# Patient Record
Sex: Male | Born: 1975 | Hispanic: Yes | Marital: Married | Smoking: Former smoker
Health system: Southern US, Community
[De-identification: ages and names within clinical notes are randomized; demographics above are authoritative.]

## PROBLEM LIST (undated history)

## (undated) DIAGNOSIS — E785 Hyperlipidemia, unspecified: Secondary | ICD-10-CM

## (undated) DIAGNOSIS — I10 Essential (primary) hypertension: Secondary | ICD-10-CM

## (undated) HISTORY — PX: REFRACTIVE SURGERY: SHX103

## (undated) HISTORY — DX: Hyperlipidemia, unspecified: E78.5

## (undated) HISTORY — DX: Essential (primary) hypertension: I10

---

## 2021-05-14 ENCOUNTER — Encounter: Payer: Self-pay | Admitting: Nurse Practitioner

## 2021-05-14 ENCOUNTER — Other Ambulatory Visit: Payer: Self-pay

## 2021-05-14 ENCOUNTER — Ambulatory Visit (INDEPENDENT_AMBULATORY_CARE_PROVIDER_SITE_OTHER): Payer: Self-pay | Admitting: Nurse Practitioner

## 2021-05-14 VITALS — BP 125/83 | HR 69 | Temp 98.0°F | Ht 68.0 in | Wt 185.0 lb

## 2021-05-14 DIAGNOSIS — I10 Essential (primary) hypertension: Secondary | ICD-10-CM

## 2021-05-14 DIAGNOSIS — Z7689 Persons encountering health services in other specified circumstances: Secondary | ICD-10-CM

## 2021-05-14 DIAGNOSIS — J029 Acute pharyngitis, unspecified: Secondary | ICD-10-CM

## 2021-05-14 DIAGNOSIS — R7303 Prediabetes: Secondary | ICD-10-CM | POA: Insufficient documentation

## 2021-05-14 DIAGNOSIS — E785 Hyperlipidemia, unspecified: Secondary | ICD-10-CM | POA: Insufficient documentation

## 2021-05-14 MED ORDER — METHYLPREDNISOLONE 4 MG PO TBPK: ORAL_TABLET | ORAL | 0 refills | Status: DC

## 2021-05-14 MED ORDER — AMLODIPINE BESYLATE 5 MG PO TABS: 5.0000 mg | ORAL_TABLET | Freq: Every day | ORAL | 0 refills | Status: DC

## 2021-05-14 MED ORDER — PRAVASTATIN SODIUM 20 MG PO TABS: 20.0000 mg | ORAL_TABLET | Freq: Every day | ORAL | 1 refills | Status: DC

## 2021-05-14 MED ORDER — VALSARTAN-HYDROCHLOROTHIAZIDE 160-12.5 MG PO TABS: 1.0000 | ORAL_TABLET | Freq: Every day | ORAL | 1 refills | Status: DC

## 2021-05-14 NOTE — Assessment & Plan Note (Signed)
Chronic. Patient unsure of diabetic status. Labs ordered today. will make recommendations based on lab results.

## 2021-05-14 NOTE — Assessment & Plan Note (Signed)
Chronic.  On pravastatin 20mg . Refill sent today. Labs ordered today. Will make recommendations based on lab results.

## 2021-05-14 NOTE — Progress Notes (Signed)
BP 125/83   Pulse 69   Temp 98 F (36.7 C) (Oral)   Ht $R'5\' 8"'sB$  (1.727 m)   Wt 185 lb (83.9 kg)   SpO2 96%   BMI 28.13 kg/m    Subjective:    Patient ID: Jason King, male    DOB: July 28, 1976, 45 y.o.   MRN: [id number]  HPI: Jason King is a 45 y.o. male  Chief Complaint  Patient presents with   Establish Care   Hypertension    Patient is here to discuss his elevated blood pressure reading over the last month. Patient states in the mornings is when he notices his blood pressure to be high, but states it is before he takes his medication.    Hyperlipidemia   Medication Refill    Patient is requesting medication refills.    Patient presents to clinic to establish care with new PCP.  Patient recently moved here from [place].  Patient reports a history of Hypertension, HLD, prediabetes.  Patient denies a history of: Thyroid problems, Depression, Anxiety, Neurological problems, and Abdominal problems.   Patient would like to discuss decreasing his medication to Amlodipine $RemoveBefor'5mg'pyQUNiHedHBi$ . States he was previously a smoker but has since stopped smoking and thinks he needs to decrease his medication.  Patient denies symptoms of hypotension including lightheaded and dizziness.  Denies HA, CP, SOB, dizziness, palpitations, visual changes, and lower extremity swelling.   Patient complains of a sore throat. Denies fever, congestion, cough.  States it hurts worse in the morning. Believes he needs something to help the sore throat go away.   Active Ambulatory Problems    Diagnosis Date Noted   Primary hypertension 05/14/2021   Hyperlipidemia 05/14/2021   Prediabetes 05/14/2021   Resolved Ambulatory Problems    Diagnosis Date Noted   No Resolved Ambulatory Problems   Past Medical History:  Diagnosis Date   Hypertension    Past Surgical History:  Procedure Laterality Date   REFRACTIVE SURGERY Bilateral     History reviewed. No pertinent family history.  Relevant past medical, surgical, family  and social history reviewed and updated as indicated. Interim medical history since our last visit reviewed. Allergies and medications reviewed and updated.  Review of Systems  Constitutional:  Negative for fever.  HENT:  Positive for sore throat. Negative for congestion.   Eyes:  Negative for visual disturbance.  Respiratory:  Negative for cough and shortness of breath.   Cardiovascular:  Negative for chest pain and leg swelling.  Neurological:  Negative for light-headedness and headaches.   Per HPI unless specifically indicated above     Objective:    BP 125/83   Pulse 69   Temp 98 F (36.7 C) (Oral)   Ht $R'5\' 8"'no$  (1.727 m)   Wt 185 lb (83.9 kg)   SpO2 96%   BMI 28.13 kg/m   Wt Readings from Last 3 Encounters:  05/14/21 185 lb (83.9 kg)    Physical Exam Vitals and nursing note reviewed.  Constitutional:      General: He is not in acute distress.    Appearance: Normal appearance. He is not ill-appearing, toxic-appearing or diaphoretic.  HENT:     Head: Normocephalic.     Right Ear: External ear normal.     Left Ear: External ear normal.     Nose: Nose normal. No congestion or rhinorrhea.     Mouth/Throat:     Mouth: Mucous membranes are moist.     Pharynx: No oropharyngeal exudate or  posterior oropharyngeal erythema.  Eyes:     General:        Right eye: No discharge.        Left eye: No discharge.     Extraocular Movements: Extraocular movements intact.     Conjunctiva/sclera: Conjunctivae normal.     Pupils: Pupils are equal, round, and reactive to light.  Cardiovascular:     Rate and Rhythm: Normal rate and regular rhythm.     Heart sounds: No murmur heard. Pulmonary:     Effort: Pulmonary effort is normal. No respiratory distress.     Breath sounds: Normal breath sounds. No wheezing, rhonchi or rales.  Abdominal:     General: Abdomen is flat. Bowel sounds are normal.  Musculoskeletal:     Cervical back: Normal range of motion and neck supple.  Skin:     General: Skin is warm and dry.     Capillary Refill: Capillary refill takes less than 2 seconds.  Neurological:     General: No focal deficit present.     Mental Status: He is alert and oriented to person, place, and time.  Psychiatric:        Mood and Affect: Mood normal.        Behavior: Behavior normal.        Thought Content: Thought content normal.        Judgment: Judgment normal.    No results found for this or any previous visit.    Assessment & Plan:   Problem List Items Addressed This Visit       Cardiovascular and Mediastinum   Primary hypertension - Primary    Chronic. Controlled. Patient insistent on decreasing his Amlodipine dose to $Remov'5mg'smLIoB$ . I advised him that he should not decrease the dose since he states his blood pressure is 140-150/80-90 at home. However, it is controlled in the office. Recommend patient bring in his home blood pressure monitor so we can compare with blood pressure monitor in the office. Sent in amlodipine $RemoveBefor'5mg'SbcSSXHHQVng$  for patient to trial and see if blood pressure increases. Discussed that high blood pressure can increase the risk of stroke and heart attack. Discussed with patient that if blood pressure increases, it will be recommended that he increase dose back to $Remov'10mg'gjDrSH$ .        Relevant Medications   amLODipine (NORVASC) 5 MG tablet   pravastatin (PRAVACHOL) 20 MG tablet   valsartan-hydrochlorothiazide (DIOVAN-HCT) 160-12.5 MG tablet   Other Relevant Orders   Comp Met (CMET)     Other   Hyperlipidemia    Chronic.  On pravastatin $RemoveBefore'20mg'sVGJqKhMATrLn$ . Refill sent today. Labs ordered today. Will make recommendations based on lab results.        Relevant Medications   amLODipine (NORVASC) 5 MG tablet   pravastatin (PRAVACHOL) 20 MG tablet   valsartan-hydrochlorothiazide (DIOVAN-HCT) 160-12.5 MG tablet   Other Relevant Orders   Lipid Profile   Prediabetes    Chronic. Patient unsure of diabetic status. Labs ordered today. will make recommendations based on lab  results.        Relevant Orders   HgB A1c   Other Visit Diagnoses     Sore throat       Medrol dose pack sent to the pharmacy to treat patient's sore throat. Follow up if symptoms worsen or fail to improve.   Encounter to establish care            Follow up plan: Return in about 1 month (around 06/14/2021) for BP Check.

## 2021-05-14 NOTE — Assessment & Plan Note (Signed)
Chronic. Controlled. Patient insistent on decreasing his Amlodipine dose to 5mg . I advised him that he should not decrease the dose since he states his blood pressure is 140-150/80-90 at home. However, it is controlled in the office. Recommend patient bring in his home blood pressure monitor so we can compare with blood pressure monitor in the office. Sent in amlodipine 5mg  for patient to trial and see if blood pressure increases. Discussed that high blood pressure can increase the risk of stroke and heart attack. Discussed with patient that if blood pressure increases, it will be recommended that he increase dose back to 10mg .

## 2021-05-15 LAB — COMPREHENSIVE METABOLIC PANEL
ALT: 30 IU/L (ref 0–44)
AST: 17 IU/L (ref 0–40)
Albumin/Globulin Ratio: 2.3 — ABNORMAL HIGH (ref 1.2–2.2)
Albumin: 4.8 g/dL (ref 4.0–5.0)
Alkaline Phosphatase: 89 IU/L (ref 44–121)
BUN/Creatinine Ratio: 14 (ref 9–20)
BUN: 21 mg/dL (ref 6–24)
Bilirubin Total: 1 mg/dL (ref 0.0–1.2)
CO2: 29 mmol/L (ref 20–29)
Calcium: 9.1 mg/dL (ref 8.7–10.2)
Chloride: 97 mmol/L (ref 96–106)
Creatinine, Ser: 1.54 mg/dL — ABNORMAL HIGH (ref 0.76–1.27)
Globulin, Total: 2.1 g/dL (ref 1.5–4.5)
Glucose: 119 mg/dL — ABNORMAL HIGH (ref 65–99)
Potassium: 2.6 mmol/L — ABNORMAL LOW (ref 3.5–5.2)
Sodium: 142 mmol/L (ref 134–144)
Total Protein: 6.9 g/dL (ref 6.0–8.5)
eGFR: 57 mL/min/{1.73_m2} — ABNORMAL LOW (ref >59)

## 2021-05-15 LAB — LIPID PANEL
Chol/HDL Ratio: 3.7 ratio (ref 0.0–5.0)
Cholesterol, Total: 147 mg/dL (ref 100–199)
HDL: 40 mg/dL (ref >39)
LDL Chol Calc (NIH): 75 mg/dL (ref 0–99)
Triglycerides: 187 mg/dL — ABNORMAL HIGH (ref 0–149)
VLDL Cholesterol Cal: 32 mg/dL (ref 5–40)

## 2021-05-15 LAB — HEMOGLOBIN A1C
Est. average glucose Bld gHb Est-mCnc: 137 mg/dL
Hgb A1c MFr Bld: 6.4 % — ABNORMAL HIGH (ref 4.8–5.6)

## 2021-05-18 NOTE — Addendum Note (Signed)
Addended by: [name] on: 05/18/2021 10:28 AM   Modules accepted: Orders

## 2021-05-18 NOTE — Progress Notes (Signed)
Please let patient know that his lab work shows that his cholesterol is well controlled.  A1c is stable at 6.4 and remains in the prediabetic range. Continue with a low carb diet and exercise.  Patient's Kidney function is declined.  I recommend he repeat it in 1 week.  I have placed the ordered. If labs continue to be abnormal he will likely need to see Nephrology to find out why kidney function is abnormal. Please make patient a lab appointment for 1 week.

## 2021-06-15 ENCOUNTER — Other Ambulatory Visit: Payer: Self-pay

## 2021-06-15 ENCOUNTER — Ambulatory Visit (INDEPENDENT_AMBULATORY_CARE_PROVIDER_SITE_OTHER): Payer: Self-pay | Admitting: Nurse Practitioner

## 2021-06-15 ENCOUNTER — Encounter: Payer: Self-pay | Admitting: Nurse Practitioner

## 2021-06-15 VITALS — BP 123/83 | HR 72 | Temp 98.0°F | Ht 68.5 in | Wt 187.4 lb

## 2021-06-15 DIAGNOSIS — I10 Essential (primary) hypertension: Secondary | ICD-10-CM

## 2021-06-15 DIAGNOSIS — R7989 Other specified abnormal findings of blood chemistry: Secondary | ICD-10-CM

## 2021-06-15 MED ORDER — VALSARTAN-HYDROCHLOROTHIAZIDE 160-12.5 MG PO TABS
1.0000 | ORAL_TABLET | Freq: Every day | ORAL | 1 refills | Status: DC
Start: 2021-06-15 — End: 2021-08-17

## 2021-06-15 MED ORDER — AMLODIPINE BESYLATE 10 MG PO TABS: 10.0000 mg | ORAL_TABLET | Freq: Every day | ORAL | 1 refills | Status: DC

## 2021-06-15 MED ORDER — PRAVASTATIN SODIUM 20 MG PO TABS: 20.0000 mg | ORAL_TABLET | Freq: Every day | ORAL | 1 refills | Status: DC

## 2021-06-15 NOTE — Progress Notes (Signed)
BP 123/83   Pulse 72   Temp 98 F (36.7 C)   Ht 5' 8.5" (1.74 m)   Wt 187 lb 6 oz (85 kg)   SpO2 94%   BMI 28.08 kg/m    Subjective:    Patient ID: Jason King, male    DOB: Sep 28, 1976, 45 y.o.   MRN: [id number]  HPI: Jason King is a 45 y.o. male  Chief Complaint  Patient presents with  . Hypertension   HYPERTENSION Hypertension status: controlled  Satisfied with current treatment? no Duration of hypertension: years BP monitoring frequency:  daily BP range: 130-140/80 BP medication side effects:  no Medication compliance: excellent compliance Previous BP meds: Amlodipine, valsartan, HCTZ Aspirin: no Recurrent headaches: no Visual changes: no Palpitations: no Dyspnea: no Chest pain: no Lower extremity edema: no Dizzy/lightheaded: no  Relevant past medical, surgical, family and social history reviewed and updated as indicated. Interim medical history since our last visit reviewed. Allergies and medications reviewed and updated.  Review of Systems  Eyes:  Negative for visual disturbance.  Respiratory:  Negative for chest tightness and shortness of breath.   Cardiovascular:  Negative for chest pain, palpitations and leg swelling.  Neurological:  Negative for dizziness, light-headedness and headaches.   Per HPI unless specifically indicated above     Objective:    BP 123/83   Pulse 72   Temp 98 F (36.7 C)   Ht 5' 8.5" (1.74 m)   Wt 187 lb 6 oz (85 kg)   SpO2 94%   BMI 28.08 kg/m   Wt Readings from Last 3 Encounters:  06/15/21 187 lb 6 oz (85 kg)  05/14/21 185 lb (83.9 kg)    Physical Exam Vitals and nursing note reviewed.  Constitutional:      General: He is not in acute distress.    Appearance: Normal appearance. He is not ill-appearing, toxic-appearing or diaphoretic.  HENT:     Head: Normocephalic.     Right Ear: External ear normal.     Left Ear: External ear normal.     Nose: Nose normal. No congestion or rhinorrhea.     Mouth/Throat:      Mouth: Mucous membranes are moist.  Eyes:     General:        Right eye: No discharge.        Left eye: No discharge.     Extraocular Movements: Extraocular movements intact.     Conjunctiva/sclera: Conjunctivae normal.     Pupils: Pupils are equal, round, and reactive to light.  Cardiovascular:     Rate and Rhythm: Normal rate and regular rhythm.     Heart sounds: No murmur heard. Pulmonary:     Effort: Pulmonary effort is normal. No respiratory distress.     Breath sounds: Normal breath sounds. No wheezing, rhonchi or rales.  Abdominal:     General: Abdomen is flat. Bowel sounds are normal.  Musculoskeletal:     Cervical back: Normal range of motion and neck supple.  Skin:    General: Skin is warm and dry.     Capillary Refill: Capillary refill takes less than 2 seconds.  Neurological:     General: No focal deficit present.     Mental Status: He is alert and oriented to person, place, and time.  Psychiatric:        Mood and Affect: Mood normal.        Behavior: Behavior normal.        Thought  Content: Thought content normal.        Judgment: Judgment normal.    Results for orders placed or performed in visit on 06/15/21  Comp Met (CMET)  Result Value Ref Range   Glucose 186 (H) 65 - 99 mg/dL   BUN 23 6 - 24 mg/dL   Creatinine, Ser 1.55 (H) 0.76 - 1.27 mg/dL   eGFR 56 (L) >59 mL/min/1.73   BUN/Creatinine Ratio 15 9 - 20   Sodium 140 134 - 144 mmol/L   Potassium 3.5 3.5 - 5.2 mmol/L   Chloride 98 96 - 106 mmol/L   CO2 27 20 - 29 mmol/L   Calcium 9.2 8.7 - 10.2 mg/dL   Total Protein 7.0 6.0 - 8.5 g/dL   Albumin 4.7 4.0 - 5.0 g/dL   Globulin, Total 2.3 1.5 - 4.5 g/dL   Albumin/Globulin Ratio 2.0 1.2 - 2.2   Bilirubin Total 0.9 0.0 - 1.2 mg/dL   Alkaline Phosphatase 88 44 - 121 IU/L   AST 27 0 - 40 IU/L   ALT 27 0 - 44 IU/L      Assessment & Plan:   Problem List Items Addressed This Visit       Cardiovascular and Mediastinum   Primary hypertension -  Primary    Chronic.  Controlled.  Continue with current medication regimen.  Refills sent today.  Labs ordered today.  Return to clinic in 3 months for reevaluation.  Call sooner if concerns arise.        Relevant Medications   amLODipine (NORVASC) 10 MG tablet   valsartan-hydrochlorothiazide (DIOVAN-HCT) 160-12.5 MG tablet   pravastatin (PRAVACHOL) 20 MG tablet   Other Visit Diagnoses     Elevated serum creatinine       Patient's creatinine was elevated at first visit. Will recheck today, if still elevated will send patient to Nephrology.   Relevant Orders   Comp Met (CMET) (Completed)        Follow up plan: Return in about 2 months (around 08/15/2021) for HTN, HLD, DM2 FU.

## 2021-06-16 ENCOUNTER — Encounter: Payer: Self-pay | Admitting: Nurse Practitioner

## 2021-06-16 ENCOUNTER — Telehealth: Payer: Self-pay

## 2021-06-16 LAB — COMPREHENSIVE METABOLIC PANEL
ALT: 27 IU/L (ref 0–44)
AST: 27 IU/L (ref 0–40)
Albumin/Globulin Ratio: 2 (ref 1.2–2.2)
Albumin: 4.7 g/dL (ref 4.0–5.0)
Alkaline Phosphatase: 88 IU/L (ref 44–121)
BUN/Creatinine Ratio: 15 (ref 9–20)
BUN: 23 mg/dL (ref 6–24)
Bilirubin Total: 0.9 mg/dL (ref 0.0–1.2)
CO2: 27 mmol/L (ref 20–29)
Calcium: 9.2 mg/dL (ref 8.7–10.2)
Chloride: 98 mmol/L (ref 96–106)
Creatinine, Ser: 1.55 mg/dL — ABNORMAL HIGH (ref 0.76–1.27)
Globulin, Total: 2.3 g/dL (ref 1.5–4.5)
Glucose: 186 mg/dL — ABNORMAL HIGH (ref 65–99)
Potassium: 3.5 mmol/L (ref 3.5–5.2)
Sodium: 140 mmol/L (ref 134–144)
Total Protein: 7 g/dL (ref 6.0–8.5)
eGFR: 56 mL/min/{1.73_m2} — ABNORMAL LOW (ref >59)

## 2021-06-16 NOTE — Telephone Encounter (Signed)
Called and spoke with patient, he states that he does not have insurance at the time and would like to wait until next year to see the kidney doctor.

## 2021-06-16 NOTE — Assessment & Plan Note (Signed)
Chronic.  Controlled.  Continue with current medication regimen.  Refills sent today.  Labs ordered today.  Return to clinic in 3 months for reevaluation.  Call sooner if concerns arise.   

## 2021-06-16 NOTE — Addendum Note (Signed)
Addended by: [name] on: 06/16/2021 08:58 AM   Modules accepted: Orders

## 2021-06-16 NOTE — Telephone Encounter (Signed)
I understand patient does not have insurance, I do not think he should wait until next year.  We need to figure out why patient is having a decrease in his kidney function before it worsens further.

## 2021-06-16 NOTE — Progress Notes (Signed)
Please let patient know that his kidney function is showing that it has still declined some.  Due to this, I have placed a referral for patient to see the kidney specialist.  Please let me know if patient has any questions.

## 2021-06-16 NOTE — Telephone Encounter (Signed)
Patient notified

## 2021-08-17 ENCOUNTER — Other Ambulatory Visit: Payer: Self-pay

## 2021-08-17 ENCOUNTER — Ambulatory Visit (INDEPENDENT_AMBULATORY_CARE_PROVIDER_SITE_OTHER): Payer: Self-pay | Admitting: Nurse Practitioner

## 2021-08-17 ENCOUNTER — Encounter: Payer: Self-pay | Admitting: Nurse Practitioner

## 2021-08-17 VITALS — BP 138/92 | HR 74 | Temp 98.4°F | Resp 18 | Ht 68.0 in | Wt 185.4 lb

## 2021-08-17 DIAGNOSIS — E785 Hyperlipidemia, unspecified: Secondary | ICD-10-CM

## 2021-08-17 DIAGNOSIS — R7303 Prediabetes: Secondary | ICD-10-CM

## 2021-08-17 DIAGNOSIS — N1831 Chronic kidney disease, stage 3a: Secondary | ICD-10-CM

## 2021-08-17 DIAGNOSIS — I10 Essential (primary) hypertension: Secondary | ICD-10-CM

## 2021-08-17 MED ORDER — PRAVASTATIN SODIUM 20 MG PO TABS: 20.0000 mg | ORAL_TABLET | Freq: Every day | ORAL | 1 refills | Status: DC

## 2021-08-17 MED ORDER — VALSARTAN-HYDROCHLOROTHIAZIDE 160-12.5 MG PO TABS: 1.0000 | ORAL_TABLET | Freq: Every day | ORAL | 1 refills | Status: DC

## 2021-08-17 MED ORDER — AMLODIPINE BESYLATE 10 MG PO TABS: 10.0000 mg | ORAL_TABLET | Freq: Every day | ORAL | 1 refills | Status: DC

## 2021-08-17 NOTE — Assessment & Plan Note (Signed)
Chronic. Patient declines seeing Nephrology. Disscused with patient the importance of getting blood pressure under control to prevent further kidney damage. Patient declines both a referral to Nephrology and increase HCTZ.  Labs ordered today. Will make further recommendations based on lab results.

## 2021-08-17 NOTE — Progress Notes (Signed)
BP (!) 138/92   Pulse 74   Temp 98.4 F (36.9 C) (Oral)   Resp 18   Ht _0  (1.727 m)   Wt 185 lb 6.4 oz (84.1 kg)   SpO2 98%   BMI 28.19 kg/m    Subjective:    Patient ID: Jason King, male    DOB: 1976-04-02, 45 y.o.   MRN: [id number]  HPI: Jason King is a 45 y.o. male  Chief Complaint  Patient presents with   Follow-up    HTN and DM   HYPERTENSION / HYPERLIPIDEMIA Satisfied with current treatment? yes Duration of hypertension: years BP monitoring frequency: weekly BP range: 140-150/90 BP medication side effects: no Past BP meds: amlodipine and valsartan-HCTZ Duration of hyperlipidemia: years Cholesterol medication side effects: no Cholesterol supplements: none Past cholesterol medications: pravastatin (pravachol) Medication compliance: excellent compliance Aspirin: no Recent stressors: no Recurrent headaches: no Visual changes: no Palpitations: no Dyspnea: no Chest pain: no Lower extremity edema: no Dizzy/lightheaded: no  DIABETES Hypoglycemic episodes:no Polydipsia/polyuria: no Visual disturbance: no Chest pain: no Paresthesias: no Glucose Monitoring: no  Accucheck frequency: Not Checking  Fasting glucose:  Post prandial:  Evening:  Before meals: Taking Insulin?: no  Long acting insulin:  Short acting insulin: Blood Pressure Monitoring: weekly Retinal Examination: Not up to Date Foot Exam: Up to Date Diabetic Education: Not Completed Pneumovax: Not up to Date Influenza: Not up to Date Aspirin: no  Relevant past medical, surgical, family and social history reviewed and updated as indicated. Interim medical history since our last visit reviewed. Allergies and medications reviewed and updated.  Review of Systems  Eyes:  Negative for visual disturbance.  Respiratory:  Negative for chest tightness and shortness of breath.   Cardiovascular:  Negative for chest pain, palpitations and leg swelling.  Endocrine: Negative for polydipsia and  polyuria.  Neurological:  Negative for dizziness, light-headedness, numbness and headaches.   Per HPI unless specifically indicated above     Objective:    BP (!) 138/92   Pulse 74   Temp 98.4 F (36.9 C) (Oral)   Resp 18   Ht _1  (1.727 m)   Wt 185 lb 6.4 oz (84.1 kg)   SpO2 98%   BMI 28.19 kg/m   Wt Readings from Last 3 Encounters:  08/17/21 185 lb 6.4 oz (84.1 kg)  06/15/21 187 lb 6 oz (85 kg)  05/14/21 185 lb (83.9 kg)    Physical Exam Vitals and nursing note reviewed.  Constitutional:      General: He is not in acute distress.    Appearance: Normal appearance. He is not ill-appearing, toxic-appearing or diaphoretic.  HENT:     Head: Normocephalic.     Right Ear: External ear normal.     Left Ear: External ear normal.     Nose: Nose normal. No congestion or rhinorrhea.     Mouth/Throat:     Mouth: Mucous membranes are moist.  Eyes:     General:        Right eye: No discharge.        Left eye: No discharge.     Extraocular Movements: Extraocular movements intact.     Conjunctiva/sclera: Conjunctivae normal.     Pupils: Pupils are equal, round, and reactive to light.  Cardiovascular:     Rate and Rhythm: Normal rate and regular rhythm.     Heart sounds: No murmur heard. Pulmonary:     Effort: Pulmonary effort is normal. No respiratory distress.  Breath sounds: Normal breath sounds. No wheezing, rhonchi or rales.  Abdominal:     General: Abdomen is flat. Bowel sounds are normal.  Musculoskeletal:     Cervical back: Normal range of motion and neck supple.  Skin:    General: Skin is warm and dry.     Capillary Refill: Capillary refill takes less than 2 seconds.  Neurological:     General: No focal deficit present.     Mental Status: He is alert and oriented to person, place, and time.  Psychiatric:        Mood and Affect: Mood normal.        Behavior: Behavior normal.        Thought Content: Thought content normal.        Judgment: Judgment normal.     Results for orders placed or performed in visit on 06/15/21  Comp Met (CMET)  Result Value Ref Range   Glucose 186 (H) 65 - 99 mg/dL   BUN 23 6 - 24 mg/dL   Creatinine, Ser 1.55 (H) 0.76 - 1.27 mg/dL   eGFR 56 (L) >59 mL/min/1.73   BUN/Creatinine Ratio 15 9 - 20   Sodium 140 134 - 144 mmol/L   Potassium 3.5 3.5 - 5.2 mmol/L   Chloride 98 96 - 106 mmol/L   CO2 27 20 - 29 mmol/L   Calcium 9.2 8.7 - 10.2 mg/dL   Total Protein 7.0 6.0 - 8.5 g/dL   Albumin 4.7 4.0 - 5.0 g/dL   Globulin, Total 2.3 1.5 - 4.5 g/dL   Albumin/Globulin Ratio 2.0 1.2 - 2.2   Bilirubin Total 0.9 0.0 - 1.2 mg/dL   Alkaline Phosphatase 88 44 - 121 IU/L   AST 27 0 - 40 IU/L   ALT 27 0 - 44 IU/L      Assessment & Plan:   Problem List Items Addressed This Visit       Cardiovascular and Mediastinum   Primary hypertension - Primary    Chronic.  Uncontrolled.  Would like to increase HCTZ however patient declines.  Explained to patient that uncontrolled hypertension can worsen kidney function.  It also increases patient's risk of stroke and heart attack.  Continue with current medication regimen.  Labs ordered today.  Refills sent today.  Return to clinic in 6 months for reevaluation.  Call sooner if concerns arise.        Relevant Medications   amLODipine (NORVASC) 10 MG tablet   pravastatin (PRAVACHOL) 20 MG tablet   valsartan-hydrochlorothiazide (DIOVAN-HCT) 160-12.5 MG tablet   Other Relevant Orders   Comp Met (CMET)     Genitourinary   Chronic kidney disease, stage 3a (HCC)    Chronic. Patient declines seeing Nephrology. Disscused with patient the importance of getting blood pressure under control to prevent further kidney damage. Patient declines both a referral to Nephrology and increase HCTZ.  Labs ordered today. Will make further recommendations based on lab results.         Other   Hyperlipidemia    Chronic.  Controlled.  Continue with current medication regimen on Pravastatin 59m.   Refills sent today. Labs ordered today.  Return to clinic in 6 months for reevaluation.  Call sooner if concerns arise.        Relevant Medications   amLODipine (NORVASC) 10 MG tablet   pravastatin (PRAVACHOL) 20 MG tablet   valsartan-hydrochlorothiazide (DIOVAN-HCT) 160-12.5 MG tablet   Other Relevant Orders   Lipid Profile   Prediabetes  Labs ordered today. Will make recommendations based on lab results.       Relevant Orders   HgB A1c     Follow up plan: Return in about 6 months (around 02/14/2022) for HTN, HLD, DM2 FU.

## 2021-08-17 NOTE — Assessment & Plan Note (Signed)
Labs ordered today.  Will make recommendations based on lab results. ?

## 2021-08-17 NOTE — Assessment & Plan Note (Signed)
Chronic.  Uncontrolled.  Would like to increase HCTZ however patient declines.  Explained to patient that uncontrolled hypertension can worsen kidney function.  It also increases patient's risk of stroke and heart attack.  Continue with current medication regimen.  Labs ordered today.  Refills sent today.  Return to clinic in 6 months for reevaluation.  Call sooner if concerns arise.

## 2021-08-17 NOTE — Assessment & Plan Note (Signed)
Chronic.  Controlled.  Continue with current medication regimen on Pravastatin 20mg .  Refills sent today. Labs ordered today.  Return to clinic in 6 months for reevaluation.  Call sooner if concerns arise.

## 2021-08-18 LAB — COMPREHENSIVE METABOLIC PANEL
ALT: 28 IU/L (ref 0–44)
AST: 17 IU/L (ref 0–40)
Albumin/Globulin Ratio: 2.6 — ABNORMAL HIGH (ref 1.2–2.2)
Albumin: 4.6 g/dL (ref 4.0–5.0)
Alkaline Phosphatase: 95 IU/L (ref 44–121)
BUN/Creatinine Ratio: 18 (ref 9–20)
BUN: 25 mg/dL — ABNORMAL HIGH (ref 6–24)
Bilirubin Total: 0.7 mg/dL (ref 0.0–1.2)
CO2: 27 mmol/L (ref 20–29)
Calcium: 9.4 mg/dL (ref 8.7–10.2)
Chloride: 100 mmol/L (ref 96–106)
Creatinine, Ser: 1.41 mg/dL — ABNORMAL HIGH (ref 0.76–1.27)
Globulin, Total: 1.8 g/dL (ref 1.5–4.5)
Glucose: 251 mg/dL — ABNORMAL HIGH (ref 70–99)
Potassium: 2.9 mmol/L — ABNORMAL LOW (ref 3.5–5.2)
Sodium: 143 mmol/L (ref 134–144)
Total Protein: 6.4 g/dL (ref 6.0–8.5)
eGFR: 63 mL/min/{1.73_m2} (ref >59)

## 2021-08-18 LAB — LIPID PANEL
Chol/HDL Ratio: 3.6 ratio (ref 0.0–5.0)
Cholesterol, Total: 133 mg/dL (ref 100–199)
HDL: 37 mg/dL — ABNORMAL LOW (ref >39)
LDL Chol Calc (NIH): 48 mg/dL (ref 0–99)
Triglycerides: 316 mg/dL — ABNORMAL HIGH (ref 0–149)
VLDL Cholesterol Cal: 48 mg/dL — ABNORMAL HIGH (ref 5–40)

## 2021-08-18 LAB — HEMOGLOBIN A1C
Est. average glucose Bld gHb Est-mCnc: 128 mg/dL
Hgb A1c MFr Bld: 6.1 % — ABNORMAL HIGH (ref 4.8–5.6)

## 2021-08-18 NOTE — Progress Notes (Signed)
Please let patient know that his kidney function remains declined.  This is likely due to his blood pressure not being well controlled as discussed during the visit.  I recommend he increase his hydrochlorothiazide and return to the clinic in 1 month.  If patient agrees to the medication change I can send it to the pharmacy for him.  I also recommend that he see Nephrology for further evaluation.  His cholesterol remains elevated.  I recommend a low fat diet and exercise. He should reduce his processed food and refined sugar intake.   His A1c remains well controlled at 6.1.

## 2021-08-24 ENCOUNTER — Telehealth: Payer: Self-pay | Admitting: Nurse Practitioner

## 2021-08-24 NOTE — Telephone Encounter (Signed)
Pt walked in requesting Lab results from last visit. Gave pt a release form to fill out. Pt requesting requesting to be emailed to him.

## 2021-08-30 NOTE — Telephone Encounter (Signed)
Will mail patient lab results, advised patient we can not email him the results.

## 2021-09-21 ENCOUNTER — Ambulatory Visit: Payer: Self-pay | Admitting: Nurse Practitioner

## 2021-10-19 ENCOUNTER — Ambulatory Visit (INDEPENDENT_AMBULATORY_CARE_PROVIDER_SITE_OTHER): Payer: 59 | Admitting: Nurse Practitioner

## 2021-10-19 ENCOUNTER — Encounter: Payer: Self-pay | Admitting: Nurse Practitioner

## 2021-10-19 ENCOUNTER — Other Ambulatory Visit: Payer: Self-pay

## 2021-10-19 VITALS — BP 142/80 | HR 86 | Temp 98.7°F | Ht 68.0 in | Wt 186.2 lb

## 2021-10-19 DIAGNOSIS — K760 Fatty (change of) liver, not elsewhere classified: Secondary | ICD-10-CM | POA: Diagnosis not present

## 2021-10-19 DIAGNOSIS — I10 Essential (primary) hypertension: Secondary | ICD-10-CM

## 2021-10-19 DIAGNOSIS — N1831 Chronic kidney disease, stage 3a: Secondary | ICD-10-CM

## 2021-10-19 MED ORDER — HYDRALAZINE HCL 10 MG PO TABS: 10.0000 mg | ORAL_TABLET | Freq: Three times a day (TID) | ORAL | 0 refills | Status: DC

## 2021-10-19 NOTE — Assessment & Plan Note (Signed)
Chronic.  Uncontrolled.  Continue with current medication regimen. Also add Hydralazine 10mg  TID.  Labs ordered today.  Return to clinic in 1 months for reevaluation.  Call sooner if concerns arise.

## 2021-10-19 NOTE — Assessment & Plan Note (Signed)
Chronic. Ongoing. Patient has new insurance and is willing to see Nephrology now that he has insurance. Referral placed for patient.

## 2021-10-19 NOTE — Assessment & Plan Note (Signed)
Patient states he has had Fatty liver in the past. Will order [place] to evaluate further. Will make recommendations based on imaging results.

## 2021-10-19 NOTE — Progress Notes (Signed)
BP (!) 142/80    Pulse 86    Temp 98.7 F (37.1 C) (Oral)    Ht $R'5\' 8"'Bw$  (1.727 m)    Wt 186 lb 3.2 oz (84.5 kg)    SpO2 97%    BMI 28.31 kg/m    Subjective:    Patient ID: Jason King, male    DOB: 1976/03/27, 46 y.o.   MRN: [id number]  HPI: Jason King is a 46 y.o. male  Chief Complaint  Patient presents with   Hyperlipidemia   Hypertension   HYPERTENSION / [place] Satisfied with current treatment? yes Duration of hypertension: years BP monitoring frequency: daily BP range: 140/80 BP medication side effects: no Past BP meds: amlodipine and valsartan-HCTZ Duration of hyperlipidemia: years Cholesterol medication side effects: no Cholesterol supplements: none Past cholesterol medications: pravastatin (pravachol) Medication compliance: excellent compliance Aspirin: no Recent stressors: no Recurrent headaches: no Visual changes: no Palpitations: no Dyspnea: no Chest pain: no Lower extremity edema: no Dizzy/lightheaded: no  DIABETES Hypoglycemic episodes:no Polydipsia/polyuria: no Visual disturbance: no Chest pain: no Paresthesias: no Glucose Monitoring: no  Accucheck frequency: Not Checking  Fasting glucose:  Post prandial:  Evening:  Before meals: Taking Insulin?: no  Long acting insulin:  Short acting insulin: Blood Pressure Monitoring: weekly Retinal Examination: Not up to Date Foot Exam: Up to Date Diabetic Education: Not Completed Pneumovax: Not up to Date Influenza: Not up to Date Aspirin: no  Relevant past medical, surgical, family and social history reviewed and updated as indicated. Interim medical history since our last visit reviewed. Allergies and medications reviewed and updated.  Review of Systems  Eyes:  Negative for visual disturbance.  Respiratory:  Negative for chest tightness and shortness of breath.   Cardiovascular:  Negative for chest pain, palpitations and leg swelling.  Endocrine: Negative for polydipsia and polyuria.   Neurological:  Negative for dizziness, light-headedness, numbness and headaches.   Per HPI unless specifically indicated above     Objective:    BP (!) 142/80    Pulse 86    Temp 98.7 F (37.1 C) (Oral)    Ht $R'5\' 8"'nj$  (1.727 m)    Wt 186 lb 3.2 oz (84.5 kg)    SpO2 97%    BMI 28.31 kg/m   Wt Readings from Last 3 Encounters:  10/19/21 186 lb 3.2 oz (84.5 kg)  08/17/21 185 lb 6.4 oz (84.1 kg)  06/15/21 187 lb 6 oz (85 kg)    Physical Exam Vitals and nursing note reviewed.  Constitutional:      General: He is not in acute distress.    Appearance: Normal appearance. He is not ill-appearing, toxic-appearing or diaphoretic.  HENT:     Head: Normocephalic.     Right Ear: External ear normal.     Left Ear: External ear normal.     Nose: Nose normal. No congestion or rhinorrhea.     Mouth/Throat:     Mouth: Mucous membranes are moist.  Eyes:     General:        Right eye: No discharge.        Left eye: No discharge.     Extraocular Movements: Extraocular movements intact.     Conjunctiva/sclera: Conjunctivae normal.     Pupils: Pupils are equal, round, and reactive to light.  Cardiovascular:     Rate and Rhythm: Normal rate and regular rhythm.     Heart sounds: No murmur heard. Pulmonary:     Effort: Pulmonary effort is normal. No  respiratory distress.     Breath sounds: Normal breath sounds. No wheezing, rhonchi or rales.  Abdominal:     General: Abdomen is flat. Bowel sounds are normal.  Musculoskeletal:     Cervical back: Normal range of motion and neck supple.  Skin:    General: Skin is warm and dry.     Capillary Refill: Capillary refill takes less than 2 seconds.  Neurological:     General: No focal deficit present.     Mental Status: He is alert and oriented to person, place, and time.  Psychiatric:        Mood and Affect: Mood normal.        Behavior: Behavior normal.        Thought Content: Thought content normal.        Judgment: Judgment normal.    Results  for orders placed or performed in visit on 08/17/21  Comp Met (CMET)  Result Value Ref Range   Glucose 251 (H) 70 - 99 mg/dL   BUN 25 (H) 6 - 24 mg/dL   Creatinine, Ser 1.41 (H) 0.76 - 1.27 mg/dL   eGFR 63 >59 mL/min/1.73   BUN/Creatinine Ratio 18 9 - 20   Sodium 143 134 - 144 mmol/L   Potassium 2.9 (L) 3.5 - 5.2 mmol/L   Chloride 100 96 - 106 mmol/L   CO2 27 20 - 29 mmol/L   Calcium 9.4 8.7 - 10.2 mg/dL   Total Protein 6.4 6.0 - 8.5 g/dL   Albumin 4.6 4.0 - 5.0 g/dL   Globulin, Total 1.8 1.5 - 4.5 g/dL   Albumin/Globulin Ratio 2.6 (H) 1.2 - 2.2   Bilirubin Total 0.7 0.0 - 1.2 mg/dL   Alkaline Phosphatase 95 44 - 121 IU/L   AST 17 0 - 40 IU/L   ALT 28 0 - 44 IU/L  Lipid Profile  Result Value Ref Range   Cholesterol, Total 133 100 - 199 mg/dL   Triglycerides 316 (H) 0 - 149 mg/dL   HDL 37 (L) >39 mg/dL   VLDL Cholesterol Cal 48 (H) 5 - 40 mg/dL   LDL Chol Calc (NIH) 48 0 - 99 mg/dL   Chol/HDL Ratio 3.6 0.0 - 5.0 ratio  HgB A1c  Result Value Ref Range   Hgb A1c MFr Bld 6.1 (H) 4.8 - 5.6 %   Est. average glucose Bld gHb Est-mCnc 128 mg/dL      Assessment & Plan:   Problem List Items Addressed This Visit       Cardiovascular and Mediastinum   Primary hypertension    Chronic.  Uncontrolled.  Continue with current medication regimen. Also add Hydralazine $RemoveBeforeD'10mg'uJGEOsuptZYeji$  TID.  Labs ordered today.  Return to clinic in 1 months for reevaluation.  Call sooner if concerns arise.        Relevant Medications   hydrALAZINE (APRESOLINE) 10 MG tablet     Digestive   Fatty liver    Patient states he has had Fatty liver in the past. Will order [place] to evaluate further. Will make recommendations based on imaging results.      Relevant Orders   US Abdomen Limited RUQ (LIVER/GB)     Genitourinary   Chronic kidney disease, stage 3a ([place]) - Primary    Chronic. Ongoing. Patient has new insurance and is willing to see Nephrology now that he has insurance. Referral placed for patient.        Relevant Orders   Comp Met (CMET)   Ambulatory referral to Nephrology  Follow up plan: Return in about 1 month (around 11/19/2021) for BP Check.

## 2021-10-20 LAB — COMPREHENSIVE METABOLIC PANEL
ALT: 26 IU/L (ref 0–44)
AST: 20 IU/L (ref 0–40)
Albumin/Globulin Ratio: 2 (ref 1.2–2.2)
Albumin: 4.3 g/dL (ref 4.0–5.0)
Alkaline Phosphatase: 98 IU/L (ref 44–121)
BUN/Creatinine Ratio: 18 (ref 9–20)
BUN: 27 mg/dL — ABNORMAL HIGH (ref 6–24)
Bilirubin Total: 0.8 mg/dL (ref 0.0–1.2)
CO2: 25 mmol/L (ref 20–29)
Calcium: 8.5 mg/dL — ABNORMAL LOW (ref 8.7–10.2)
Chloride: 102 mmol/L (ref 96–106)
Creatinine, Ser: 1.47 mg/dL — ABNORMAL HIGH (ref 0.76–1.27)
Globulin, Total: 2.1 g/dL (ref 1.5–4.5)
Glucose: 263 mg/dL — ABNORMAL HIGH (ref 70–99)
Potassium: 2.7 mmol/L — ABNORMAL LOW (ref 3.5–5.2)
Sodium: 144 mmol/L (ref 134–144)
Total Protein: 6.4 g/dL (ref 6.0–8.5)
eGFR: 60 mL/min/{1.73_m2} (ref >59)

## 2021-10-20 NOTE — Progress Notes (Signed)
Please let patient know that his kidney function remains declined.  I recommend he follow through with the nephrology referral as discussed during our visit yesterday.

## 2021-10-26 ENCOUNTER — Ambulatory Visit
Admission: RE | Admit: 2021-10-26 | Discharge: 2021-10-26 | Disposition: A | Discharge status: Home / Self Care | Payer: 59 | Source: Ambulatory Visit | Attending: Nurse Practitioner | Admitting: Nurse Practitioner

## 2021-10-26 ENCOUNTER — Other Ambulatory Visit: Payer: Self-pay

## 2021-10-26 DIAGNOSIS — K760 Fatty (change of) liver, not elsewhere classified: Secondary | ICD-10-CM | POA: Insufficient documentation

## 2021-10-27 NOTE — Progress Notes (Signed)
Please let patient know that his US shows that he has a polyp on his gallbladder.  There is nothing to do at this time to treat it. We will monitor it in the future.  The [place] also showed that he has fatty liver with a structure on the left lobe of the liver that they are not able to identify through an [place].  The radiologist recommends that he have a Ct done in 3 months to evaluate this.

## 2021-11-17 ENCOUNTER — Other Ambulatory Visit: Payer: Self-pay | Admitting: Nurse Practitioner

## 2021-11-17 NOTE — Telephone Encounter (Signed)
Requested medication (s) are due for refill today: yes  Requested medication (s) are on the active medication list: yes  Last refill:  10/19/21 #90 no RF  Future visit scheduled: 11/23/21  Notes to clinic:  Failed protocol of labs within 360 days, has upcoming appt, please assess.   Requested Prescriptions  Pending Prescriptions Disp Refills   hydrALAZINE (APRESOLINE) 10 MG tablet [Pharmacy Med Name: HYDRALAZINE 10 MG TABLETS (ORANGE)] 90 tablet 0    Sig: TAKE 1 TABLET(10 MG) BY MOUTH THREE TIMES DAILY     Cardiovascular:  Vasodilators Failed - 11/17/2021  3:27 AM      Failed - HCT in normal range and within 360 days    No results found for: HCT, HCTKUC, SRHCT        Failed - HGB in normal range and within 360 days    No results found for: HGB, HGBKUC, HGBPOCKUC, HGBOTHER, TOTHGB, HGBPLASMA        Failed - RBC in normal range and within 360 days    No results found for: RBC, RBCKUC        Failed - WBC in normal range and within 360 days    No results found for: WBC, WBCKUC        Failed - PLT in normal range and within 360 days    No results found for: PLT, PLTCOUNTKUC, LABPLAT, POCPLA        Failed - ANA Screen, Ifa, Serum in normal range and within 360 days    No results found for: ANA, ANATITER, LABANTI        Failed - Last BP in normal range    BP Readings from Last 1 Encounters:  10/19/21 (!) 142/80          Passed - Valid encounter within last 12 months    Recent Outpatient Visits           4 weeks ago Chronic kidney disease, stage 3a (HCC)   [facility] [name], NP   3 months ago Primary hypertension   [facility] [name], NP   5 months ago Primary hypertension   [facility] [name], NP   6 months ago Primary hypertension   [facility] [name], NP       Future Appointments             In 6 days [name], NP [facility]

## 2021-11-22 NOTE — Progress Notes (Signed)
BP 140/85    Pulse 79    Temp 97.7 F (36.5 C) (Oral)    Wt 183 lb 6.4 oz (83.2 kg)    SpO2 96%    BMI 27.89 kg/m    Subjective:    Patient ID: Jason King, male    DOB: 04-16-1976, 46 y.o.   MRN: [id number]  HPI: Jason King is a 46 y.o. male  Chief Complaint  Patient presents with   Hypertension   HYPERTENSION Hypertension status: controlled  Satisfied with current treatment? no Duration of hypertension: years BP monitoring frequency:  not checking BP range:  BP medication side effects:  no Medication compliance: excellent compliance Previous BP meds: hydralazine, amlodipine, and valsartan-HCTZ Aspirin: no Recurrent headaches: no Visual changes: no Palpitations: no Dyspnea: no Chest pain: no Lower extremity edema: no Dizzy/lightheaded: no  CHRONIC KIDNEY DISEASE CKD status: controlled Medications renally dose: yes Previous renal evaluation:  has appt at the end of February Pneumovax:  Not up to Date Influenza Vaccine:  Not up to Date   Relevant past medical, surgical, family and social history reviewed and updated as indicated. Interim medical history since our last visit reviewed. Allergies and medications reviewed and updated.  Review of Systems  Eyes:  Negative for visual disturbance.  Respiratory:  Negative for shortness of breath.   Cardiovascular:  Negative for chest pain and leg swelling.  Neurological:  Negative for light-headedness and headaches.   Per HPI unless specifically indicated above     Objective:    BP 140/85    Pulse 79    Temp 97.7 F (36.5 C) (Oral)    Wt 183 lb 6.4 oz (83.2 kg)    SpO2 96%    BMI 27.89 kg/m   Wt Readings from Last 3 Encounters:  11/23/21 183 lb 6.4 oz (83.2 kg)  10/19/21 186 lb 3.2 oz (84.5 kg)  08/17/21 185 lb 6.4 oz (84.1 kg)    Physical Exam Vitals and nursing note reviewed.  Constitutional:      General: He is not in acute distress.    Appearance: Normal appearance. He is not ill-appearing,  toxic-appearing or diaphoretic.  HENT:     Head: Normocephalic.     Right Ear: External ear normal.     Left Ear: External ear normal.     Nose: Nose normal. No congestion or rhinorrhea.     Mouth/Throat:     Mouth: Mucous membranes are moist.  Eyes:     General:        Right eye: No discharge.        Left eye: No discharge.     Extraocular Movements: Extraocular movements intact.     Conjunctiva/sclera: Conjunctivae normal.     Pupils: Pupils are equal, round, and reactive to light.  Cardiovascular:     Rate and Rhythm: Normal rate and regular rhythm.     Heart sounds: No murmur heard. Pulmonary:     Effort: Pulmonary effort is normal. No respiratory distress.     Breath sounds: Normal breath sounds. No wheezing, rhonchi or rales.  Abdominal:     General: Abdomen is flat. Bowel sounds are normal.  Musculoskeletal:     Cervical back: Normal range of motion and neck supple.  Skin:    General: Skin is warm and dry.     Capillary Refill: Capillary refill takes less than 2 seconds.  Neurological:     General: No focal deficit present.     Mental Status: He  is alert and oriented to person, place, and time.  Psychiatric:        Mood and Affect: Mood normal.        Behavior: Behavior normal.        Thought Content: Thought content normal.        Judgment: Judgment normal.    Results for orders placed or performed in visit on 10/19/21  Comp Met (CMET)  Result Value Ref Range   Glucose 263 (H) 70 - 99 mg/dL   BUN 27 (H) 6 - 24 mg/dL   Creatinine, Ser 1.47 (H) 0.76 - 1.27 mg/dL   eGFR 60 >59 mL/min/1.73   BUN/Creatinine Ratio 18 9 - 20   Sodium 144 134 - 144 mmol/L   Potassium 2.7 (L) 3.5 - 5.2 mmol/L   Chloride 102 96 - 106 mmol/L   CO2 25 20 - 29 mmol/L   Calcium 8.5 (L) 8.7 - 10.2 mg/dL   Total Protein 6.4 6.0 - 8.5 g/dL   Albumin 4.3 4.0 - 5.0 g/dL   Globulin, Total 2.1 1.5 - 4.5 g/dL   Albumin/Globulin Ratio 2.0 1.2 - 2.2   Bilirubin Total 0.8 0.0 - 1.2 mg/dL    Alkaline Phosphatase 98 44 - 121 IU/L   AST 20 0 - 40 IU/L   ALT 26 0 - 44 IU/L      Assessment & Plan:   Problem List Items Addressed This Visit       Cardiovascular and Mediastinum   Primary hypertension    Chronic.  Improving.  Elevated on first check but improved on recheck .  Will continue with current medication regimen.  Refilled Hydralazine.  Labs ordered today.  Return to clinic in 2 months for reevaluation.  Call sooner if concerns arise.        Relevant Medications   hydrALAZINE (APRESOLINE) 10 MG tablet     Digestive   Fatty liver    Reviewed results with patient during visit. Will repeat imaging in April as recommended by Radiology.        Genitourinary   Chronic kidney disease, stage 3a ([place]) - Primary    Has appt with Nephrology for initial evaluation at the end of the month.        Follow up plan: Return in about 2 months (around 01/21/2022).

## 2021-11-23 ENCOUNTER — Ambulatory Visit (INDEPENDENT_AMBULATORY_CARE_PROVIDER_SITE_OTHER): Payer: 59 | Admitting: Nurse Practitioner

## 2021-11-23 ENCOUNTER — Other Ambulatory Visit: Payer: Self-pay

## 2021-11-23 ENCOUNTER — Encounter: Payer: Self-pay | Admitting: Nurse Practitioner

## 2021-11-23 VITALS — BP 140/85 | HR 79 | Temp 97.7°F | Wt 183.4 lb

## 2021-11-23 DIAGNOSIS — N1831 Chronic kidney disease, stage 3a: Secondary | ICD-10-CM | POA: Diagnosis not present

## 2021-11-23 DIAGNOSIS — K760 Fatty (change of) liver, not elsewhere classified: Secondary | ICD-10-CM | POA: Diagnosis not present

## 2021-11-23 DIAGNOSIS — I10 Essential (primary) hypertension: Secondary | ICD-10-CM

## 2021-11-23 MED ORDER — HYDRALAZINE HCL 10 MG PO TABS: 10.0000 mg | ORAL_TABLET | Freq: Three times a day (TID) | ORAL | 1 refills | Status: DC

## 2021-11-23 NOTE — Assessment & Plan Note (Signed)
Has appt with Nephrology for initial evaluation at the end of the month.

## 2021-11-23 NOTE — Assessment & Plan Note (Signed)
Chronic.  Improving.  Elevated on first check but improved on recheck .  Will continue with current medication regimen.  Refilled Hydralazine.  Labs ordered today.  Return to clinic in 2 months for reevaluation.  Call sooner if concerns arise.

## 2021-11-23 NOTE — Assessment & Plan Note (Signed)
Reviewed results with patient during visit. Will repeat imaging in April as recommended by Radiology.

## 2021-12-07 ENCOUNTER — Other Ambulatory Visit: Payer: Self-pay | Admitting: Nephrology

## 2021-12-07 DIAGNOSIS — N1831 Chronic kidney disease, stage 3a: Secondary | ICD-10-CM

## 2021-12-07 DIAGNOSIS — I10 Essential (primary) hypertension: Secondary | ICD-10-CM

## 2021-12-07 DIAGNOSIS — R801 Persistent proteinuria, unspecified: Secondary | ICD-10-CM

## 2021-12-14 ENCOUNTER — Ambulatory Visit
Admission: RE | Admit: 2021-12-14 | Discharge: 2021-12-14 | Disposition: A | Discharge status: Home / Self Care | Payer: 59 | Source: Ambulatory Visit | Attending: Nephrology | Admitting: Nephrology

## 2021-12-14 ENCOUNTER — Other Ambulatory Visit: Payer: Self-pay

## 2021-12-14 DIAGNOSIS — N1831 Chronic kidney disease, stage 3a: Secondary | ICD-10-CM

## 2021-12-14 DIAGNOSIS — R801 Persistent proteinuria, unspecified: Secondary | ICD-10-CM

## 2021-12-14 DIAGNOSIS — I10 Essential (primary) hypertension: Secondary | ICD-10-CM

## 2022-01-17 NOTE — Progress Notes (Signed)
? ?BP (!) 140/95   Pulse 85   Temp 98.4 ?F (36.9 ?C) (Oral)   Wt 184 lb (83.5 kg)   SpO2 96%   BMI 27.98 kg/m?   ? ?Subjective:  ? ? Patient ID: Jason King, male    DOB: 1976/03/29, 46 y.o.   MRN: [id number] ? ?HPI: ?Jason King is a 46 y.o. male ? ?Chief Complaint  ?Patient presents with  ? Hypertension  ? Chronic Kidney Disease  ? ?HYPERTENSION ?Hypertension status: controlled  ?Satisfied with current treatment? no ?Duration of hypertension: years ?BP monitoring frequency:  couple of times a week ?BP range: 150/90 ?BP medication side effects:  no ?Medication compliance: excellent compliance ?Previous BP meds: hydralazine, amlodipine, and valsartan-HCTZ, spirolactone- hypokalemia. Added by Nephrology ?Aspirin: no ?Recurrent headaches: no ?Visual changes: no ?Palpitations: no ?Dyspnea: no ?Chest pain: no ?Lower extremity edema: no ?Dizzy/lightheaded: no ? ?CHRONIC KIDNEY DISEASE ?CKD status: controlled ?Medications renally dose: yes ?Previous renal evaluation:  ?Pneumovax:  Not up to Date ?Influenza Vaccine:  Not up to Date ? ? ?Relevant past medical, surgical, family and social history reviewed and updated as indicated. Interim medical history since our last visit reviewed. ?Allergies and medications reviewed and updated. ? ?Review of Systems  ?Eyes:  Negative for visual disturbance.  ?Respiratory:  Negative for shortness of breath.   ?Cardiovascular:  Negative for chest pain and leg swelling.  ?Neurological:  Negative for light-headedness and headaches.  ? ?Per HPI unless specifically indicated above ? ?   ?Objective:  ?  ?BP (!) 140/95   Pulse 85   Temp 98.4 ?F (36.9 ?C) (Oral)   Wt 184 lb (83.5 kg)   SpO2 96%   BMI 27.98 kg/m?   ?Wt Readings from Last 3 Encounters:  ?01/18/22 184 lb (83.5 kg)  ?11/23/21 183 lb 6.4 oz (83.2 kg)  ?10/19/21 186 lb 3.2 oz (84.5 kg)  ?  ?Physical Exam ?Vitals and nursing note reviewed.  ?Constitutional:   ?   General: He is not in acute distress. ?   Appearance: Normal  appearance. He is not ill-appearing, toxic-appearing or diaphoretic.  ?HENT:  ?   Head: Normocephalic.  ?   Right Ear: External ear normal.  ?   Left Ear: External ear normal.  ?   Nose: Nose normal. No congestion or rhinorrhea.  ?   Mouth/Throat:  ?   Mouth: Mucous membranes are moist.  ?Eyes:  ?   General:     ?   Right eye: No discharge.     ?   Left eye: No discharge.  ?   Extraocular Movements: Extraocular movements intact.  ?   Conjunctiva/sclera: Conjunctivae normal.  ?   Pupils: Pupils are equal, round, and reactive to light.  ?Cardiovascular:  ?   Rate and Rhythm: Normal rate and regular rhythm.  ?   Heart sounds: No murmur heard. ?Pulmonary:  ?   Effort: Pulmonary effort is normal. No respiratory distress.  ?   Breath sounds: Normal breath sounds. No wheezing, rhonchi or rales.  ?Abdominal:  ?   General: Abdomen is flat. Bowel sounds are normal.  ?Musculoskeletal:  ?   Cervical back: Normal range of motion and neck supple.  ?Skin: ?   General: Skin is warm and dry.  ?   Capillary Refill: Capillary refill takes less than 2 seconds.  ?Neurological:  ?   General: No focal deficit present.  ?   Mental Status: He is alert and oriented to person, place, and  time.  ?Psychiatric:     ?   Mood and Affect: Mood normal.     ?   Behavior: Behavior normal.     ?   Thought Content: Thought content normal.     ?   Judgment: Judgment normal.  ? ? ?Results for orders placed or performed in visit on 10/19/21  ?Comp Met (CMET)  ?Result Value Ref Range  ? Glucose 263 (H) 70 - 99 mg/dL  ? BUN 27 (H) 6 - 24 mg/dL  ? Creatinine, Ser 1.47 (H) 0.76 - 1.27 mg/dL  ? eGFR 60 >59 mL/min/1.73  ? BUN/Creatinine Ratio 18 9 - 20  ? Sodium 144 134 - 144 mmol/L  ? Potassium 2.7 (L) 3.5 - 5.2 mmol/L  ? Chloride 102 96 - 106 mmol/L  ? CO2 25 20 - 29 mmol/L  ? Calcium 8.5 (L) 8.7 - 10.2 mg/dL  ? Total Protein 6.4 6.0 - 8.5 g/dL  ? Albumin 4.3 4.0 - 5.0 g/dL  ? Globulin, Total 2.1 1.5 - 4.5 g/dL  ? Albumin/Globulin Ratio 2.0 1.2 - 2.2  ?  Bilirubin Total 0.8 0.0 - 1.2 mg/dL  ? Alkaline Phosphatase 98 44 - 121 IU/L  ? AST 20 0 - 40 IU/L  ? ALT 26 0 - 44 IU/L  ? ?   ?Assessment & Plan:  ? ?Problem List Items Addressed This Visit   ? ?  ? Cardiovascular and Mediastinum  ? Primary hypertension - Primary  ?  Chronic.  Elevated at visit today. Will increase Hydralazine to $RemoveBefore'25mg'[id number]$  TID.  Continue with Diovan, HCTZ, Amlodipine, and Spirnolactone.  Labs ordered today.  Follow up in 1 month for reevaluation. ? ?  ?  ? Relevant Medications  ? spironolactone (ALDACTONE) 25 MG tablet  ? hydrALAZINE (APRESOLINE) 25 MG tablet  ? pravastatin (PRAVACHOL) 20 MG tablet  ? valsartan-hydrochlorothiazide (DIOVAN-HCT) 160-12.5 MG tablet  ? amLODipine (NORVASC) 10 MG tablet  ? Other Relevant Orders  ? Comp Met (CMET)  ?  ? Genitourinary  ? Chronic kidney disease, stage 3a ([place])  ?  Chronic. Labs ordered today. Continue to follow up with Nephrology.  Will make recommendations based on lab results.  Will work on HTN control and Diabetes control.  ?  ?  ?  ? Other  ? Hyperlipidemia  ?  Chronic.  Controlled.  Continue with current medication regimen of Pravastatin $RemoveBefore'20mg'[id number]$  daily.  Labs ordered today.  Return to clinic in 6 months for reevaluation.  Call sooner if concerns arise.  ? ?  ?  ? Relevant Medications  ? spironolactone (ALDACTONE) 25 MG tablet  ? hydrALAZINE (APRESOLINE) 25 MG tablet  ? pravastatin (PRAVACHOL) 20 MG tablet  ? valsartan-hydrochlorothiazide (DIOVAN-HCT) 160-12.5 MG tablet  ? amLODipine (NORVASC) 10 MG tablet  ? Other Relevant Orders  ? Lipid Profile  ? Prediabetes  ?  Chronic. Labs ordered today.  Nephrology recommended Glipizide or Januvia.  Patient declined at this time.  Lengthy discussion had about Diabetes and CKD.  Discussed the importance of keeping diabetes well controlled.  Will make further recommendations based on lab results.  Follow up in 6 months for reevaluation. ?  ?  ? Relevant Orders  ? HgB A1c  ?  ? ?Follow up plan: ?Return in about 1 month  (around 02/17/2022) for BP Check. ? ? ? ? ? ?

## 2022-01-18 ENCOUNTER — Ambulatory Visit (INDEPENDENT_AMBULATORY_CARE_PROVIDER_SITE_OTHER): Payer: 59 | Admitting: Nurse Practitioner

## 2022-01-18 ENCOUNTER — Encounter: Payer: Self-pay | Admitting: Nurse Practitioner

## 2022-01-18 VITALS — BP 140/95 | HR 85 | Temp 98.4°F | Wt 184.0 lb

## 2022-01-18 DIAGNOSIS — E785 Hyperlipidemia, unspecified: Secondary | ICD-10-CM

## 2022-01-18 DIAGNOSIS — N1831 Chronic kidney disease, stage 3a: Secondary | ICD-10-CM

## 2022-01-18 DIAGNOSIS — R7303 Prediabetes: Secondary | ICD-10-CM | POA: Diagnosis not present

## 2022-01-18 DIAGNOSIS — K760 Fatty (change of) liver, not elsewhere classified: Secondary | ICD-10-CM

## 2022-01-18 DIAGNOSIS — I10 Essential (primary) hypertension: Secondary | ICD-10-CM

## 2022-01-18 MED ORDER — HYDRALAZINE HCL 25 MG PO TABS: 25.0000 mg | ORAL_TABLET | Freq: Three times a day (TID) | ORAL | 1 refills | Status: DC

## 2022-01-18 MED ORDER — VALSARTAN-HYDROCHLOROTHIAZIDE 160-12.5 MG PO TABS: 1.0000 | ORAL_TABLET | Freq: Every day | ORAL | 1 refills | Status: DC

## 2022-01-18 MED ORDER — PRAVASTATIN SODIUM 20 MG PO TABS: 20.0000 mg | ORAL_TABLET | Freq: Every day | ORAL | 1 refills | Status: DC

## 2022-01-18 MED ORDER — AMLODIPINE BESYLATE 10 MG PO TABS: 10.0000 mg | ORAL_TABLET | Freq: Every day | ORAL | 1 refills | Status: DC

## 2022-01-18 NOTE — Assessment & Plan Note (Signed)
Chronic.  Elevated at visit today. Will increase Hydralazine to 25mg  TID.  Continue with Diovan, HCTZ, Amlodipine, and Spirnolactone.  Labs ordered today.  Follow up in 1 month for reevaluation. ? ?

## 2022-01-18 NOTE — Assessment & Plan Note (Signed)
Chronic.  Controlled.  Continue with current medication regimen of Pravastatin 20mg daily.  Labs ordered today.  Return to clinic in 6 months for reevaluation.  Call sooner if concerns arise.   

## 2022-01-18 NOTE — Assessment & Plan Note (Signed)
Chronic. Labs ordered today. Continue to follow up with Nephrology.  Will make recommendations based on lab results.  Will work on HTN control and Diabetes control.  ?

## 2022-01-18 NOTE — Assessment & Plan Note (Signed)
Chronic. Labs ordered today.  Nephrology recommended Glipizide or Januvia.  Patient declined at this time.  Lengthy discussion had about Diabetes and CKD.  Discussed the importance of keeping diabetes well controlled.  Will make further recommendations based on lab results.  Follow up in 6 months for reevaluation. ?

## 2022-01-19 LAB — LIPID PANEL
Chol/HDL Ratio: 4.1 ratio (ref 0.0–5.0)
Cholesterol, Total: 162 mg/dL (ref 100–199)
HDL: 40 mg/dL (ref >39)
LDL Chol Calc (NIH): 78 mg/dL (ref 0–99)
Triglycerides: 271 mg/dL — ABNORMAL HIGH (ref 0–149)
VLDL Cholesterol Cal: 44 mg/dL — ABNORMAL HIGH (ref 5–40)

## 2022-01-19 LAB — COMPREHENSIVE METABOLIC PANEL
ALT: 25 IU/L (ref 0–44)
AST: 16 IU/L (ref 0–40)
Albumin/Globulin Ratio: 1.8 (ref 1.2–2.2)
Albumin: 4.3 g/dL (ref 4.0–5.0)
Alkaline Phosphatase: 88 IU/L (ref 44–121)
BUN/Creatinine Ratio: 22 — ABNORMAL HIGH (ref 9–20)
BUN: 32 mg/dL — ABNORMAL HIGH (ref 6–24)
Bilirubin Total: 0.8 mg/dL (ref 0.0–1.2)
CO2: 25 mmol/L (ref 20–29)
Calcium: 9.4 mg/dL (ref 8.7–10.2)
Chloride: 102 mmol/L (ref 96–106)
Creatinine, Ser: 1.46 mg/dL — ABNORMAL HIGH (ref 0.76–1.27)
Globulin, Total: 2.4 g/dL (ref 1.5–4.5)
Glucose: 201 mg/dL — ABNORMAL HIGH (ref 70–99)
Potassium: 3.6 mmol/L (ref 3.5–5.2)
Sodium: 144 mmol/L (ref 134–144)
Total Protein: 6.7 g/dL (ref 6.0–8.5)
eGFR: 60 mL/min/{1.73_m2} (ref >59)

## 2022-01-19 LAB — HEMOGLOBIN A1C
Est. average glucose Bld gHb Est-mCnc: 140 mg/dL
Hgb A1c MFr Bld: 6.5 % — ABNORMAL HIGH (ref 4.8–5.6)

## 2022-01-19 NOTE — Progress Notes (Signed)
Please let patient know that his lab work shows that his kidney function remains stable.  His a1c did increase to 6.5 which is in the diabetic range. I recommend he start Januvia daily.  If he agrees, I can send this to the pharmacy.   ? ?Cholesterol is improved.  However, triglycerides are still elevated.  Recommend decreasing processed foods and refined sugar intake.  ? ?Follow up as discussed.

## 2022-01-21 MED ORDER — SITAGLIPTIN PHOSPHATE 25 MG PO TABS: 25.0000 mg | ORAL_TABLET | Freq: Every day | ORAL | 1 refills | Status: DC

## 2022-01-21 NOTE — Addendum Note (Signed)
Addended by: [name] on: 01/21/2022 06:34 AM ? ? Modules accepted: Orders ? ?

## 2022-01-21 NOTE — Progress Notes (Signed)
Januvia sent to the pharmacy for patient.

## 2022-02-16 ENCOUNTER — Other Ambulatory Visit: Payer: Self-pay | Admitting: Nurse Practitioner

## 2022-02-16 NOTE — Telephone Encounter (Signed)
Refused pravastatin 20 mg and valsartan/HCTZ 160/12.5 mg because on 01/18/2022 #90, 1 refill was sent in for both and received at the pharmacy on the same date at 11:07 AM.   ?

## 2022-02-28 NOTE — Progress Notes (Signed)
? ?BP 138/87   Pulse 75   Temp 98.6 ?F (37 ?C) (Oral)   Wt 176 lb (79.8 kg)   SpO2 96%   BMI 26.76 kg/m?   ? ?Subjective:  ? ? Patient ID: Jason King, male    DOB: Dec 26, 1975, 46 y.o.   MRN: [id number] ? ?HPI: ?Jason King is a 46 y.o. male ? ?Chief Complaint  ?Patient presents with  ? Hypertension  ?  1 month follow up HTN   ? ?HYPERTENSION ?Hypertension status: controlled  ?Satisfied with current treatment? no ?Duration of hypertension: years ?BP monitoring frequency:  couple of times a week ?BP range: 150/90 ?BP medication side effects:  no ?Medication compliance: excellent compliance ?Previous BP meds: hydralazine, amlodipine, and valsartan-HCTZ, spirolactone- hypokalemia. Added by Nephrology ?Aspirin: no ?Recurrent headaches: no ?Visual changes: no ?Palpitations: no ?Dyspnea: no ?Chest pain: no ?Lower extremity edema: no ?Dizzy/lightheaded: no ?Patient states he is doing well.  Working on losing weight.  ? ? ? ?Relevant past medical, surgical, family and social history reviewed and updated as indicated. Interim medical history since our last visit reviewed. ?Allergies and medications reviewed and updated. ? ?Review of Systems  ?Eyes:  Negative for visual disturbance.  ?Respiratory:  Negative for shortness of breath.   ?Cardiovascular:  Negative for chest pain and leg swelling.  ?Neurological:  Negative for light-headedness and headaches.  ? ?Per HPI unless specifically indicated above ? ?   ?Objective:  ?  ?BP 138/87   Pulse 75   Temp 98.6 ?F (37 ?C) (Oral)   Wt 176 lb (79.8 kg)   SpO2 96%   BMI 26.76 kg/m?   ?Wt Readings from Last 3 Encounters:  ?03/01/22 176 lb (79.8 kg)  ?01/18/22 184 lb (83.5 kg)  ?11/23/21 183 lb 6.4 oz (83.2 kg)  ?  ?Physical Exam ?Vitals and nursing note reviewed.  ?Constitutional:   ?   General: He is not in acute distress. ?   Appearance: Normal appearance. He is normal weight. He is not ill-appearing, toxic-appearing or diaphoretic.  ?HENT:  ?   Head: Normocephalic.  ?    Right Ear: External ear normal.  ?   Left Ear: External ear normal.  ?   Nose: Nose normal. No congestion or rhinorrhea.  ?   Mouth/Throat:  ?   Mouth: Mucous membranes are moist.  ?Eyes:  ?   General:     ?   Right eye: No discharge.     ?   Left eye: No discharge.  ?   Extraocular Movements: Extraocular movements intact.  ?   Conjunctiva/sclera: Conjunctivae normal.  ?   Pupils: Pupils are equal, round, and reactive to light.  ?Cardiovascular:  ?   Rate and Rhythm: Normal rate and regular rhythm.  ?   Heart sounds: No murmur heard. ?Pulmonary:  ?   Effort: Pulmonary effort is normal. No respiratory distress.  ?   Breath sounds: Normal breath sounds. No wheezing, rhonchi or rales.  ?Abdominal:  ?   General: Abdomen is flat. Bowel sounds are normal.  ?Musculoskeletal:  ?   Cervical back: Normal range of motion and neck supple.  ?Skin: ?   General: Skin is warm and dry.  ?   Capillary Refill: Capillary refill takes less than 2 seconds.  ?Neurological:  ?   General: No focal deficit present.  ?   Mental Status: He is alert and oriented to person, place, and time.  ?Psychiatric:     ?   Mood  and Affect: Mood normal.     ?   Behavior: Behavior normal.     ?   Thought Content: Thought content normal.     ?   Judgment: Judgment normal.  ? ? ?Results for orders placed or performed in visit on 01/18/22  ?Comp Met (CMET)  ?Result Value Ref Range  ? Glucose 201 (H) 70 - 99 mg/dL  ? BUN 32 (H) 6 - 24 mg/dL  ? Creatinine, Ser 1.46 (H) 0.76 - 1.27 mg/dL  ? eGFR 60 >59 mL/min/1.73  ? BUN/Creatinine Ratio 22 (H) 9 - 20  ? Sodium 144 134 - 144 mmol/L  ? Potassium 3.6 3.5 - 5.2 mmol/L  ? Chloride 102 96 - 106 mmol/L  ? CO2 25 20 - 29 mmol/L  ? Calcium 9.4 8.7 - 10.2 mg/dL  ? Total Protein 6.7 6.0 - 8.5 g/dL  ? Albumin 4.3 4.0 - 5.0 g/dL  ? Globulin, Total 2.4 1.5 - 4.5 g/dL  ? Albumin/Globulin Ratio 1.8 1.2 - 2.2  ? Bilirubin Total 0.8 0.0 - 1.2 mg/dL  ? Alkaline Phosphatase 88 44 - 121 IU/L  ? AST 16 0 - 40 IU/L  ? ALT 25 0 - 44  IU/L  ?HgB A1c  ?Result Value Ref Range  ? Hgb A1c MFr Bld 6.5 (H) 4.8 - 5.6 %  ? Est. average glucose Bld gHb Est-mCnc 140 mg/dL  ?Lipid Profile  ?Result Value Ref Range  ? Cholesterol, Total 162 100 - 199 mg/dL  ? Triglycerides 271 (H) 0 - 149 mg/dL  ? HDL 40 >39 mg/dL  ? VLDL Cholesterol Cal 44 (H) 5 - 40 mg/dL  ? LDL Chol Calc (NIH) 78 0 - 99 mg/dL  ? Chol/HDL Ratio 4.1 0.0 - 5.0 ratio  ? ?   ?Assessment & Plan:  ? ?Problem List Items Addressed This Visit   ? ?  ? Cardiovascular and Mediastinum  ? Primary hypertension  ?  Chronic.  Controlled.  Continue with current medication regimen.  Labs ordered today.  Return to clinic in 2 months for reevaluation.  Call sooner if concerns arise.  ? ? ?  ?  ?  ? Endocrine  ? Controlled type 2 diabetes mellitus with complication, without long-term current use of insulin ([place]) - Primary  ?  Chronic. Controlled.  Patient did not start Janvuia due to cost.  Has last almost 10lbs since last visit.  Encouraged him to continue with weight loss. Will recheck labs in 2 months.  Follow up in 2 months for reevaluation. ? ?  ?  ? ?Other Visit Diagnoses   ? ? Hypokalemia      ? Will recheck labs today to see if he needs Aldactone.  Patient did not start aldactone after nephrology appt.   ? Relevant Orders  ? Comp Met (CMET)  ? ?  ?  ? ?Follow up plan: ?Return in about 2 months (around 05/01/2022) for HTN, HLD, DM2 FU. ? ? ?A total of 30 minutes were spent on this encounter today.  When total time is documented, this includes both the face-to-face and non-face-to-face time personally spent before, during and after the visit on the date of the encounter reviewing medications, weight loss, and discussing follow up.  ? ? ?

## 2022-03-01 ENCOUNTER — Ambulatory Visit (INDEPENDENT_AMBULATORY_CARE_PROVIDER_SITE_OTHER): Payer: 59 | Admitting: Nurse Practitioner

## 2022-03-01 ENCOUNTER — Encounter: Payer: Self-pay | Admitting: Nurse Practitioner

## 2022-03-01 VITALS — BP 138/87 | HR 75 | Temp 98.6°F | Wt 176.0 lb

## 2022-03-01 DIAGNOSIS — E876 Hypokalemia: Secondary | ICD-10-CM

## 2022-03-01 DIAGNOSIS — I10 Essential (primary) hypertension: Secondary | ICD-10-CM

## 2022-03-01 DIAGNOSIS — E118 Type 2 diabetes mellitus with unspecified complications: Secondary | ICD-10-CM | POA: Diagnosis not present

## 2022-03-01 NOTE — Assessment & Plan Note (Signed)
Chronic. Controlled.  Patient did not start Janvuia due to cost.  Has last almost 10lbs since last visit.  Encouraged him to continue with weight loss. Will recheck labs in 2 months.  Follow up in 2 months for reevaluation. ?

## 2022-03-01 NOTE — Assessment & Plan Note (Signed)
Chronic.  Controlled.  Continue with current medication regimen.  Labs ordered today.  Return to clinic in 2 months for reevaluation.  Call sooner if concerns arise.   

## 2022-03-02 LAB — COMPREHENSIVE METABOLIC PANEL
ALT: 30 IU/L (ref 0–44)
AST: 18 IU/L (ref 0–40)
Albumin/Globulin Ratio: 2.1 (ref 1.2–2.2)
Albumin: 4.5 g/dL (ref 4.0–5.0)
Alkaline Phosphatase: 84 IU/L (ref 44–121)
BUN/Creatinine Ratio: 17 (ref 9–20)
BUN: 24 mg/dL (ref 6–24)
Bilirubin Total: 0.9 mg/dL (ref 0.0–1.2)
CO2: 27 mmol/L (ref 20–29)
Calcium: 9 mg/dL (ref 8.7–10.2)
Chloride: 101 mmol/L (ref 96–106)
Creatinine, Ser: 1.38 mg/dL — ABNORMAL HIGH (ref 0.76–1.27)
Globulin, Total: 2.1 g/dL (ref 1.5–4.5)
Glucose: 272 mg/dL — ABNORMAL HIGH (ref 70–99)
Potassium: 2.9 mmol/L — ABNORMAL LOW (ref 3.5–5.2)
Sodium: 143 mmol/L (ref 134–144)
Total Protein: 6.6 g/dL (ref 6.0–8.5)
eGFR: 65 mL/min/{1.73_m2} (ref >59)

## 2022-03-02 MED ORDER — SPIRONOLACTONE 25 MG PO TABS: 25.0000 mg | ORAL_TABLET | Freq: Every day | ORAL | 1 refills | Status: DC

## 2022-03-02 NOTE — Progress Notes (Signed)
Please let patient know that his lab work shows that his kidney function is stable.  His potassium is low.  I recommend he start the Aldactone discussed during visit yesterday.  If he needs me to send this to the pharmacy I can. He should come back in 2 weeks and have the labs rechecked.

## 2022-03-02 NOTE — Addendum Note (Signed)
Addended by: [name] on: 03/02/2022 07:43 AM ? ? Modules accepted: Orders ? ?

## 2022-03-02 NOTE — Addendum Note (Signed)
Addended by: [name] on: 03/02/2022 09:59 AM ? ? Modules accepted: Orders ? ?

## 2022-03-02 NOTE — Progress Notes (Signed)
Medication sent to the pharmacy.

## 2022-03-03 ENCOUNTER — Ambulatory Visit: Payer: Self-pay | Admitting: *Deleted

## 2022-03-03 NOTE — Telephone Encounter (Signed)
I returned his call.  He is waiting for an rx for potassium that's not at the pharmacy.  He isn't sure of the name of the medication.  (Looking in his chart [name], NP wants to start him on Aldactone due to low potassium level).    Reason for Disposition  [1] Prescription not at pharmacy AND [2] was prescribed by PCP recently (Exception: triager has access to EMR and prescription is recorded there. Go to Home Care and confirm for pharmacy.)  Answer Assessment - Initial Assessment Questions 1. NAME of MEDICATION: "What medicine are you calling about?"     Potassium.    It's not at the pharmacy.   Also BP medication.   I'm not sure of the name of it. 2. QUESTION: "What is your question?" (e.g., double dose of medicine, side effect)     The medication for the potassium is not at the pharmacy.  [facility] on [address].  I looked in his chart and the medication [name], NP prescribed for him due to low potassium is Aldactone.   I let pt know this and spelled out the name of the medication.   He is going to check back with [facility] and see if they have it.   I let him  know this was for his BP too because he asked about medication for his BP.      3. PRESCRIBING HCP: "Who prescribed it?" Reason: if prescribed by specialist, call should be referred to that group.     [name], NP 4. SYMPTOMS: "Do you have any symptoms?"      5. SEVERITY: If symptoms are present, ask "Are they mild, moderate or severe?"      6. PREGNANCY:  "Is there any chance that you are pregnant?" "When was your last menstrual period?"  Protocols used: Medication Question Call-A-AH

## 2022-03-22 ENCOUNTER — Other Ambulatory Visit: Payer: 59

## 2022-03-22 DIAGNOSIS — E876 Hypokalemia: Secondary | ICD-10-CM

## 2022-03-23 LAB — COMPREHENSIVE METABOLIC PANEL
ALT: 24 IU/L (ref 0–44)
AST: 13 IU/L (ref 0–40)
Albumin/Globulin Ratio: 2.1 (ref 1.2–2.2)
Albumin: 4.6 g/dL (ref 4.0–5.0)
Alkaline Phosphatase: 95 IU/L (ref 44–121)
BUN/Creatinine Ratio: 18 (ref 9–20)
BUN: 27 mg/dL — ABNORMAL HIGH (ref 6–24)
Bilirubin Total: 0.6 mg/dL (ref 0.0–1.2)
CO2: 25 mmol/L (ref 20–29)
Calcium: 9.2 mg/dL (ref 8.7–10.2)
Chloride: 104 mmol/L (ref 96–106)
Creatinine, Ser: 1.48 mg/dL — ABNORMAL HIGH (ref 0.76–1.27)
Globulin, Total: 2.2 g/dL (ref 1.5–4.5)
Glucose: 157 mg/dL — ABNORMAL HIGH (ref 70–99)
Potassium: 3.4 mmol/L — ABNORMAL LOW (ref 3.5–5.2)
Sodium: 142 mmol/L (ref 134–144)
Total Protein: 6.8 g/dL (ref 6.0–8.5)
eGFR: 59 mL/min/{1.73_m2} — ABNORMAL LOW (ref >59)

## 2022-03-23 NOTE — Progress Notes (Signed)
Please let patient know that his Potassium increased.  Make sure to continue to aldactone.  Please let me know if he has any questions.

## 2022-05-02 NOTE — Progress Notes (Unsigned)
There were no vitals taken for this visit.   Subjective:    Patient ID: Jason King, male    DOB: [birth year], 46 y.o.   MRN: [id number]  HPI: Jason King is a 46 y.o. male  No chief complaint on file.  HYPERTENSION Hypertension status: controlled  Satisfied with current treatment? no Duration of hypertension: years BP monitoring frequency:  couple of times a week BP range: 150/90 BP medication side effects:  no Medication compliance: excellent compliance Previous BP meds: hydralazine, amlodipine, and valsartan-HCTZ, spirolactone- hypokalemia. Added by Nephrology Aspirin: no Recurrent headaches: no Visual changes: no Palpitations: no Dyspnea: no Chest pain: no Lower extremity edema: no Dizzy/lightheaded: no  CHRONIC KIDNEY DISEASE CKD status: controlled Medications renally dose: yes Previous renal evaluation:  Pneumovax:  Not up to Date Influenza Vaccine:  Not up to Date   Relevant past medical, surgical, family and social history reviewed and updated as indicated. Interim medical history since our last visit reviewed. Allergies and medications reviewed and updated.  Review of Systems  Eyes:  Negative for visual disturbance.  Respiratory:  Negative for shortness of breath.   Cardiovascular:  Negative for chest pain and leg swelling.  Neurological:  Negative for light-headedness and headaches.    Per HPI unless specifically indicated above     Objective:    There were no vitals taken for this visit.  Wt Readings from Last 3 Encounters:  03/01/22 176 lb (79.8 kg)  01/18/22 184 lb (83.5 kg)  11/23/21 183 lb 6.4 oz (83.2 kg)    Physical Exam Vitals and nursing note reviewed.  Constitutional:      General: He is not in acute distress.    Appearance: Normal appearance. He is not ill-appearing, toxic-appearing or diaphoretic.  HENT:     Head: Normocephalic.     Right Ear: External ear normal.     Left Ear: External ear normal.     Nose: Nose normal. No  congestion or rhinorrhea.     Mouth/Throat:     Mouth: Mucous membranes are moist.  Eyes:     General:        Right eye: No discharge.        Left eye: No discharge.     Extraocular Movements: Extraocular movements intact.     Conjunctiva/sclera: Conjunctivae normal.     Pupils: Pupils are equal, round, and reactive to light.  Cardiovascular:     Rate and Rhythm: Normal rate and regular rhythm.     Heart sounds: No murmur heard. Pulmonary:     Effort: Pulmonary effort is normal. No respiratory distress.     Breath sounds: Normal breath sounds. No wheezing, rhonchi or rales.  Abdominal:     General: Abdomen is flat. Bowel sounds are normal.  Musculoskeletal:     Cervical back: Normal range of motion and neck supple.  Skin:    General: Skin is warm and dry.     Capillary Refill: Capillary refill takes less than 2 seconds.  Neurological:     General: No focal deficit present.     Mental Status: He is alert and oriented to person, place, and time.  Psychiatric:        Mood and Affect: Mood normal.        Behavior: Behavior normal.        Thought Content: Thought content normal.        Judgment: Judgment normal.     Results for orders placed or performed in visit  on 03/22/22  Comp Met (CMET)  Result Value Ref Range   Glucose 157 (H) 70 - 99 mg/dL   BUN 27 (H) 6 - 24 mg/dL   Creatinine, Ser 1.48 (H) 0.76 - 1.27 mg/dL   eGFR 59 (L) >59 mL/min/1.73   BUN/Creatinine Ratio 18 9 - 20   Sodium 142 134 - 144 mmol/L   Potassium 3.4 (L) 3.5 - 5.2 mmol/L   Chloride 104 96 - 106 mmol/L   CO2 25 20 - 29 mmol/L   Calcium 9.2 8.7 - 10.2 mg/dL   Total Protein 6.8 6.0 - 8.5 g/dL   Albumin 4.6 4.0 - 5.0 g/dL   Globulin, Total 2.2 1.5 - 4.5 g/dL   Albumin/Globulin Ratio 2.1 1.2 - 2.2   Bilirubin Total 0.6 0.0 - 1.2 mg/dL   Alkaline Phosphatase 95 44 - 121 IU/L   AST 13 0 - 40 IU/L   ALT 24 0 - 44 IU/L      Assessment & Plan:   Problem List Items Addressed This Visit        Cardiovascular and Mediastinum   Primary hypertension - Primary     Endocrine   Controlled type 2 diabetes mellitus with complication, without long-term current use of insulin ([facility])     Genitourinary   Chronic kidney disease, stage 3a ([place])     Other   Hyperlipidemia   Prediabetes     Follow up plan: No follow-ups on file.

## 2022-05-03 ENCOUNTER — Ambulatory Visit (INDEPENDENT_AMBULATORY_CARE_PROVIDER_SITE_OTHER): Payer: 59 | Admitting: Nurse Practitioner

## 2022-05-03 ENCOUNTER — Encounter: Payer: Self-pay | Admitting: Nurse Practitioner

## 2022-05-03 VITALS — BP 135/87 | HR 73 | Temp 97.8°F | Wt 179.2 lb

## 2022-05-03 DIAGNOSIS — K219 Gastro-esophageal reflux disease without esophagitis: Secondary | ICD-10-CM | POA: Diagnosis not present

## 2022-05-03 DIAGNOSIS — I10 Essential (primary) hypertension: Secondary | ICD-10-CM

## 2022-05-03 DIAGNOSIS — E118 Type 2 diabetes mellitus with unspecified complications: Secondary | ICD-10-CM | POA: Diagnosis not present

## 2022-05-03 DIAGNOSIS — E785 Hyperlipidemia, unspecified: Secondary | ICD-10-CM

## 2022-05-03 DIAGNOSIS — N1831 Chronic kidney disease, stage 3a: Secondary | ICD-10-CM

## 2022-05-03 MED ORDER — OMEPRAZOLE 20 MG PO CPDR: 20.0000 mg | DELAYED_RELEASE_CAPSULE | Freq: Every day | ORAL | 1 refills | Status: DC

## 2022-05-03 NOTE — Assessment & Plan Note (Signed)
Chronic.  Controlled.  Continue with current medication regimen of Metoformin 500mg  daily.  Last A1c 6.5%.  Labs ordered today.  Return to clinic in 3 months for reevaluation.  Call sooner if concerns arise.

## 2022-05-03 NOTE — Assessment & Plan Note (Signed)
Chronic. Secondary to uncontrolled HTN.  Blood pressure well controlled on Amlodipine, Valsartan, HCTZ and hydralazine.  Would benefit from SGLT2/GLP1.  Not able to afford it at this time.  States he is taking the Spirnolactone.  Will recheck CMP today to evaluate Potassium.

## 2022-05-03 NOTE — Assessment & Plan Note (Signed)
Chronic.  Controlled.  Continue with current medication regimen.  Labs ordered today.  Return to clinic in 3 months for reevaluation.  Call sooner if concerns arise.   

## 2022-05-04 LAB — COMPREHENSIVE METABOLIC PANEL
ALT: 19 IU/L (ref 0–44)
AST: 16 IU/L (ref 0–40)
Albumin/Globulin Ratio: 2.3 — ABNORMAL HIGH (ref 1.2–2.2)
Albumin: 4.6 g/dL (ref 4.1–5.1)
Alkaline Phosphatase: 80 IU/L (ref 44–121)
BUN/Creatinine Ratio: 15 (ref 9–20)
BUN: 23 mg/dL (ref 6–24)
Bilirubin Total: 0.7 mg/dL (ref 0.0–1.2)
CO2: 25 mmol/L (ref 20–29)
Calcium: 9.3 mg/dL (ref 8.7–10.2)
Chloride: 104 mmol/L (ref 96–106)
Creatinine, Ser: 1.5 mg/dL — ABNORMAL HIGH (ref 0.76–1.27)
Globulin, Total: 2 g/dL (ref 1.5–4.5)
Glucose: 148 mg/dL — ABNORMAL HIGH (ref 70–99)
Potassium: 3.4 mmol/L — ABNORMAL LOW (ref 3.5–5.2)
Sodium: 145 mmol/L — ABNORMAL HIGH (ref 134–144)
Total Protein: 6.6 g/dL (ref 6.0–8.5)
eGFR: 59 mL/min/{1.73_m2} — ABNORMAL LOW (ref >59)

## 2022-05-04 LAB — HEMOGLOBIN A1C
Est. average glucose Bld gHb Est-mCnc: 123 mg/dL
Hgb A1c MFr Bld: 5.9 % — ABNORMAL HIGH (ref 4.8–5.6)

## 2022-05-04 NOTE — Progress Notes (Signed)
Please let patient know that his kidney function remains stable.  His potassium is the same as 1 month ago.  Make sure to keep taking the Spironolactone.  His A1c improved to 5.9 which is great news.  Continue with other medications.  Follow up as discussed.

## 2022-07-22 ENCOUNTER — Other Ambulatory Visit: Payer: Self-pay | Admitting: Nurse Practitioner

## 2022-07-22 NOTE — Telephone Encounter (Signed)
Requested medication (s) are due for refill today: yes  Requested medication (s) are on the active medication list: yes  Last refill:  01/18/22  Future visit scheduled: yes  Notes to clinic:  overdue lab work   Requested Prescriptions  Pending Prescriptions Disp Refills   hydrALAZINE (APRESOLINE) 25 MG tablet [Pharmacy Med Name: HYDRALAZINE  25MG  TABLETS(ORANGE)] 270 tablet 1    Sig: TAKE 1 TABLET(25 MG) BY MOUTH THREE TIMES DAILY     Cardiovascular:  Vasodilators Failed - 07/22/2022  3:28 AM      Failed - HCT in normal range and within 360 days    No results found for: "HCT", "HCTKUC", "SRHCT"       Failed - HGB in normal range and within 360 days    No results found for: "HGB", "HGBKUC", "[place]", "HGBOTHER", "TOTHGB", "HGBPLASMA"       Failed - RBC in normal range and within 360 days    No results found for: "RBC", "RBCKUC"       Failed - WBC in normal range and within 360 days    No results found for: "WBC", "[place]"       Failed - PLT in normal range and within 360 days    No results found for: "PLT", "PLTCOUNTKUC", "LABPLAT", "POCPLA"       Failed - ANA Screen, Ifa, Serum in normal range and within 360 days    No results found for: "ANA", "ANATITER", "LABANTI"       Passed - Last BP in normal range    BP Readings from Last 1 Encounters:  05/03/22 135/87         Passed - Valid encounter within last 12 months    Recent Outpatient Visits           2 months ago Controlled type 2 diabetes mellitus with complication, without long-term current use of insulin ([place])   [facility] Jon Billings, NP   4 months ago Controlled type 2 diabetes mellitus with complication, without long-term current use of insulin ([place])   [facility] Jon Billings, NP   6 months ago Primary hypertension   [facility] Jon Billings, NP   8 months ago Chronic kidney disease, stage 3a ([place])   [facility], Karen, NP   9  months ago Chronic kidney disease, stage 3a ([place])   [place] Jon Billings, NP       Future Appointments             In 2 weeks Jon Billings, NP [facility] [place]

## 2022-08-08 NOTE — Progress Notes (Deleted)
There were no vitals taken for this visit.   Subjective:    Patient ID: Jason King, male    DOB: Oct 03, 1976, 46 y.o.   MRN: [id number]  HPI: Jason King is a 46 y.o. male  No chief complaint on file.  HYPERTENSION Hypertension status: controlled  Satisfied with current treatment? no Duration of hypertension: years BP monitoring frequency:  couple of times a week BP range: 150/90 BP medication side effects:  no Medication compliance: excellent compliance Previous BP meds: hydralazine, amlodipine, and valsartan-HCTZ, spirolactone- hypokalemia. Added by Nephrology Aspirin: no Recurrent headaches: no Visual changes: no Palpitations: no Dyspnea: no Chest pain: no Lower extremity edema: no Dizzy/lightheaded: no  CHRONIC KIDNEY DISEASE CKD status: controlled Medications renally dose: yes Previous renal evaluation:  Pneumovax:  Not up to Date Influenza Vaccine:  Not up to Date  GERD GERD control status: uncontrolled Satisfied with current treatment? no Heartburn frequency: several times a week Medication side effects: no  Medication compliance: worse Previous GERD medications: Antacid use frequency:  none Duration: all night Nature: burning sensation Location: epigastric Heartburn duration: years Alleviatiating factors:  hasn't taken anything Aggravating factors: worse at night when he lays down Dysphagia: no Odynophagia:  no Hematemesis: no Blood in stool: no EGD: no   Relevant past medical, surgical, family and social history reviewed and updated as indicated. Interim medical history since our last visit reviewed. Allergies and medications reviewed and updated.  Review of Systems  Eyes:  Negative for visual disturbance.  Respiratory:  Negative for shortness of breath.   Cardiovascular:  Negative for chest pain and leg swelling.  Gastrointestinal:  Positive for abdominal pain.  Neurological:  Negative for light-headedness and headaches.    Per HPI  unless specifically indicated above     Objective:    There were no vitals taken for this visit.  Wt Readings from Last 3 Encounters:  05/03/22 179 lb 3.2 oz (81.3 kg)  03/01/22 176 lb (79.8 kg)  01/18/22 184 lb (83.5 kg)    Physical Exam Vitals and nursing note reviewed.  Constitutional:      General: He is not in acute distress.    Appearance: Normal appearance. He is not ill-appearing, toxic-appearing or diaphoretic.  HENT:     Head: Normocephalic.     Right Ear: External ear normal.     Left Ear: External ear normal.     Nose: Nose normal. No congestion or rhinorrhea.     Mouth/Throat:     Mouth: Mucous membranes are moist.  Eyes:     General:        Right eye: No discharge.        Left eye: No discharge.     Extraocular Movements: Extraocular movements intact.     Conjunctiva/sclera: Conjunctivae normal.     Pupils: Pupils are equal, round, and reactive to light.  Cardiovascular:     Rate and Rhythm: Normal rate and regular rhythm.     Heart sounds: No murmur heard. Pulmonary:     Effort: Pulmonary effort is normal. No respiratory distress.     Breath sounds: Normal breath sounds. No wheezing, rhonchi or rales.  Abdominal:     General: Abdomen is flat. Bowel sounds are normal.  Musculoskeletal:     Cervical back: Normal range of motion and neck supple.  Skin:    General: Skin is warm and dry.     Capillary Refill: Capillary refill takes less than 2 seconds.  Neurological:  General: No focal deficit present.     Mental Status: He is alert and oriented to person, place, and time.  Psychiatric:        Mood and Affect: Mood normal.        Behavior: Behavior normal.        Thought Content: Thought content normal.        Judgment: Judgment normal.    Results for orders placed or performed in visit on 05/03/22  Comp Met (CMET)  Result Value Ref Range   Glucose 148 (H) 70 - 99 mg/dL   BUN 23 6 - 24 mg/dL   Creatinine, Ser 1.50 (H) 0.76 - 1.27 mg/dL   eGFR 59  (L) >59 mL/min/1.73   BUN/Creatinine Ratio 15 9 - 20   Sodium 145 (H) 134 - 144 mmol/L   Potassium 3.4 (L) 3.5 - 5.2 mmol/L   Chloride 104 96 - 106 mmol/L   CO2 25 20 - 29 mmol/L   Calcium 9.3 8.7 - 10.2 mg/dL   Total Protein 6.6 6.0 - 8.5 g/dL   Albumin 4.6 4.1 - 5.1 g/dL   Globulin, Total 2.0 1.5 - 4.5 g/dL   Albumin/Globulin Ratio 2.3 (H) 1.2 - 2.2   Bilirubin Total 0.7 0.0 - 1.2 mg/dL   Alkaline Phosphatase 80 44 - 121 IU/L   AST 16 0 - 40 IU/L   ALT 19 0 - 44 IU/L  HgB A1c  Result Value Ref Range   Hgb A1c MFr Bld 5.9 (H) 4.8 - 5.6 %   Est. average glucose Bld gHb Est-mCnc 123 mg/dL      Assessment & Plan:   Problem List Items Addressed This Visit      Cardiovascular and Mediastinum   Primary hypertension - Primary     Endocrine   Controlled type 2 diabetes mellitus with complication, without long-term current use of insulin (HCC)     Genitourinary   Chronic kidney disease, stage 3a ([place])     Other   Hyperlipidemia     Follow up plan: No follow-ups on file.

## 2022-08-09 ENCOUNTER — Ambulatory Visit: Payer: 59 | Admitting: Nurse Practitioner

## 2022-08-09 ENCOUNTER — Encounter: Payer: Self-pay | Admitting: Nurse Practitioner

## 2022-08-09 VITALS — BP 137/92 | HR 74 | Temp 97.6°F | Wt 188.0 lb

## 2022-08-09 DIAGNOSIS — E118 Type 2 diabetes mellitus with unspecified complications: Secondary | ICD-10-CM

## 2022-08-09 DIAGNOSIS — Z23 Encounter for immunization: Secondary | ICD-10-CM | POA: Diagnosis not present

## 2022-08-09 DIAGNOSIS — K824 Cholesterolosis of gallbladder: Secondary | ICD-10-CM

## 2022-08-09 DIAGNOSIS — Z0000 Encounter for general adult medical examination without abnormal findings: Secondary | ICD-10-CM | POA: Diagnosis not present

## 2022-08-09 DIAGNOSIS — I10 Essential (primary) hypertension: Secondary | ICD-10-CM

## 2022-08-09 DIAGNOSIS — Z1159 Encounter for screening for other viral diseases: Secondary | ICD-10-CM

## 2022-08-09 DIAGNOSIS — E785 Hyperlipidemia, unspecified: Secondary | ICD-10-CM

## 2022-08-09 DIAGNOSIS — Z114 Encounter for screening for human immunodeficiency virus [HIV]: Secondary | ICD-10-CM

## 2022-08-09 DIAGNOSIS — N1831 Chronic kidney disease, stage 3a: Secondary | ICD-10-CM | POA: Diagnosis not present

## 2022-08-09 DIAGNOSIS — R160 Hepatomegaly, not elsewhere classified: Secondary | ICD-10-CM

## 2022-08-09 DIAGNOSIS — R69 Illness, unspecified: Secondary | ICD-10-CM | POA: Diagnosis not present

## 2022-08-09 LAB — MICROALBUMIN, URINE WAIVED
Creatinine, Urine Waived: 100 mg/dL (ref 10–300)
Microalb, Ur Waived: 150 mg/L — ABNORMAL HIGH (ref 0–19)
Microalb/Creat Ratio: >300 mg/g — ABNORMAL HIGH (ref <30)

## 2022-08-09 MED ORDER — AMLODIPINE BESYLATE 10 MG PO TABS: 10.0000 mg | ORAL_TABLET | Freq: Every day | ORAL | 1 refills | Status: DC

## 2022-08-09 MED ORDER — SPIRONOLACTONE 25 MG PO TABS: 25.0000 mg | ORAL_TABLET | Freq: Every day | ORAL | 1 refills | Status: DC

## 2022-08-09 MED ORDER — HYDRALAZINE HCL 25 MG PO TABS: 50.0000 mg | ORAL_TABLET | Freq: Two times a day (BID) | ORAL | 1 refills | Status: DC

## 2022-08-09 MED ORDER — VALSARTAN-HYDROCHLOROTHIAZIDE 160-12.5 MG PO TABS: 1.0000 | ORAL_TABLET | Freq: Every day | ORAL | 1 refills | Status: DC

## 2022-08-09 MED ORDER — PRAVASTATIN SODIUM 20 MG PO TABS: 20.0000 mg | ORAL_TABLET | Freq: Every day | ORAL | 1 refills | Status: DC

## 2022-08-09 MED ORDER — METFORMIN HCL ER (OSM) 500 MG PO TB24: 500.0000 mg | ORAL_TABLET | Freq: Every day | ORAL | 1 refills | Status: DC

## 2022-08-09 NOTE — Assessment & Plan Note (Signed)
Chronic. Secondary to uncontrolled HTN.  Blood pressure not well controlled on Amlodipine, Valsartan, HCTZ and hydralazine.  Would benefit from SGLT2/GLP1.  Not able to afford it at this time.  States he is taking the Spirnolactone.  Will recheck CMP today to evaluate Potassium.

## 2022-08-09 NOTE — Assessment & Plan Note (Signed)
Chronic.  Controlled.  Continue with current medication regimen of Pravastatin.  Refill sent today.  Labs ordered today.  Return to clinic in 6 months for reevaluation.  Call sooner if concerns arise.   

## 2022-08-09 NOTE — Assessment & Plan Note (Signed)
Chronic. Controlled.  Has been out of Metformin for 2 weeks.  Refilled today.  Encouraged him to continue with weight loss. Labs ordered today.  Will make recommendations based on lab results.   Follow up in 3 months for reevaluation.  Call sooner if concerns arise.

## 2022-08-09 NOTE — Progress Notes (Signed)
BP (!) 150/99   Pulse 76   Temp 97.6 F (36.4 C) (Oral)   Wt 188 lb (85.3 kg)   SpO2 96%   BMI 28.59 kg/m    Subjective:    Patient ID: Jason King, male    DOB: [birth year], 46 y.o.   MRN: [id number]  HPI: Mahmoud Blazejewski is a 46 y.o. male presenting on 08/09/2022 for comprehensive medical examination. Current medical complaints include:none  He currently lives with: Interim Problems from his last visit: no   HYPERTENSION Hypertension status: controlled  Satisfied with current treatment? no Duration of hypertension: years BP monitoring frequency:  couple of times a week BP range: 150/90 BP medication side effects:  no Medication compliance: excellent compliance Previous BP meds:hydralazine, amlodipine, and valsartan-HCTZ, spirolactone- hypokalemia Aspirin: no Recurrent headaches: no Visual changes: no Palpitations: no Dyspnea: no Chest pain: no Lower extremity edema: no Dizzy/lightheaded: no  CHRONIC KIDNEY DISEASE CKD status: controlled Medications renally dose: yes Previous renal evaluation:  Pneumovax:  Not up to Date Influenza Vaccine:  Not up to Date  GERD GERD control status: uncontrolled Satisfied with current treatment? no Heartburn frequency: several times a week Medication side effects: no  Medication compliance: worse Previous GERD medications: Antacid use frequency:  none Duration: all night Nature: burning sensation Location: epigastric Heartburn duration: years Alleviatiating factors:  hasn't taken anything Aggravating factors: worse at night when he lays down Dysphagia: no Odynophagia:  no Hematemesis: no Blood in stool: no EGD: no  DIABETES Hypoglycemic episodes:no Polydipsia/polyuria: no Visual disturbance: no Chest pain: no Paresthesias: no Glucose Monitoring: no  Accucheck frequency: Not Checking  Fasting glucose:  Post prandial:  Evening:  Before meals: Taking Insulin?: no  Long acting insulin:  Short acting  insulin: Blood Pressure Monitoring: not checking Retinal Examination: Not up to Date Foot Exam: Up to Date Diabetic Education: Not Completed Pneumovax: Up to Date Influenza: Up to Date Aspirin: no  Patient states he has been having some pain at times in his RUQ.  He has had this pain for awhile but it has increased in frequency.  No diarrhea, nausea, or vomiting.  Depression Screen done today and results listed below:     08/09/2022    9:46 AM 05/03/2022    9:30 AM 03/01/2022   10:30 AM 01/18/2022   10:57 AM 11/23/2021   10:11 AM  Depression screen PHQ 2/9  Decreased Interest 0 3 0 0 0  Down, Depressed, Hopeless 0 0 0 0 0  PHQ - 2 Score 0 3 0 0 0  Altered sleeping 0 _0 0  Tired, decreased energy 0 _1 0  Change in appetite 0 0 0 0 0  Feeling bad or failure about yourself  0 0 0 0 0  Trouble concentrating 0 1 0 0 0  Moving slowly or fidgety/restless 0 1 0 0 0  Suicidal thoughts 0 0 0 0 0  PHQ-9 Score 0 _2 0  Difficult doing work/chores Not difficult at all Somewhat difficult Not difficult at all Not difficult at all Not difficult at all    The patient does not have a history of falls. I did complete a risk assessment for falls. A plan of care for falls was documented.   Past Medical History:  Past Medical History:  Diagnosis Date   Hyperlipidemia    Hypertension     Surgical History:  Past Surgical History:  Procedure Laterality Date   REFRACTIVE SURGERY Bilateral  Medications:  Current Outpatient Medications on File Prior to Visit  Medication Sig   omeprazole (PRILOSEC) 20 MG capsule Take 1 capsule (20 mg total) by mouth daily.   No current facility-administered medications on file prior to visit.    Allergies:  No Known Allergies  Social History:  Social History   Socioeconomic History   Marital status: Married    Spouse name: Not on file   Number of children: Not on file   Years of education: Not on file   Highest education level: Not on  file  Occupational History   Not on file  Tobacco Use   Smoking status: Former    Types: Cigarettes   Smokeless tobacco: Former  Scientific laboratory technician Use: Never used  Substance and Sexual Activity   Alcohol use: Not Currently   Drug use: Never   Sexual activity: Not Currently  Other Topics Concern   Not on file  Social History Narrative   Not on file   Social Determinants of Health   Financial Resource Strain: Not on file  Food Insecurity: Not on file  Transportation Needs: Not on file  Physical Activity: Not on file  Stress: Not on file  Social Connections: Not on file  Intimate Partner Violence: Not on file   Social History   Tobacco Use  Smoking Status Former   Types: Cigarettes  Smokeless Tobacco Former   Social History   Substance and Sexual Activity  Alcohol Use Not Currently    Family History:  History reviewed. No pertinent family history.  Past medical history, surgical history, medications, allergies, family history and social history reviewed with patient today and changes made to appropriate areas of the chart.   Review of Systems  HENT:         Denies vision changes.  Eyes:  Negative for blurred vision and double vision.  Respiratory:  Negative for shortness of breath.   Cardiovascular:  Negative for chest pain, palpitations and leg swelling.  Gastrointestinal:  Positive for abdominal pain. Negative for diarrhea, nausea and vomiting.  Neurological:  Negative for dizziness, tingling and headaches.  Endo/Heme/Allergies:  Negative for polydipsia.       Denies Polyuria   All other ROS negative except what is listed above and in the HPI.      Objective:    BP (!) 150/99   Pulse 76   Temp 97.6 F (36.4 C) (Oral)   Wt 188 lb (85.3 kg)   SpO2 96%   BMI 28.59 kg/m   Wt Readings from Last 3 Encounters:  08/09/22 188 lb (85.3 kg)  05/03/22 179 lb 3.2 oz (81.3 kg)  03/01/22 176 lb (79.8 kg)    Physical Exam Vitals and nursing note reviewed.   Constitutional:      General: He is not in acute distress.    Appearance: Normal appearance. He is normal weight. He is not ill-appearing, toxic-appearing or diaphoretic.  HENT:     Head: Normocephalic.     Right Ear: Tympanic membrane, ear canal and external ear normal.     Left Ear: Tympanic membrane, ear canal and external ear normal.     Nose: Nose normal. No congestion or rhinorrhea.     Mouth/Throat:     Mouth: Mucous membranes are moist.  Eyes:     General:        Right eye: No discharge.        Left eye: No discharge.     Extraocular Movements: Extraocular  movements intact.     Conjunctiva/sclera: Conjunctivae normal.     Pupils: Pupils are equal, round, and reactive to light.  Cardiovascular:     Rate and Rhythm: Normal rate and regular rhythm.     Heart sounds: No murmur heard. Pulmonary:     Effort: Pulmonary effort is normal. No respiratory distress.     Breath sounds: Normal breath sounds. No wheezing, rhonchi or rales.  Abdominal:     General: Abdomen is flat. Bowel sounds are normal. There is no distension.     Palpations: Abdomen is soft.     Tenderness: There is no abdominal tenderness. There is no guarding.  Musculoskeletal:     Cervical back: Normal range of motion and neck supple.  Skin:    General: Skin is warm and dry.     Capillary Refill: Capillary refill takes less than 2 seconds.  Neurological:     General: No focal deficit present.     Mental Status: He is alert and oriented to person, place, and time.     Cranial Nerves: No cranial nerve deficit.     Motor: No weakness.     Deep Tendon Reflexes: Reflexes normal.  Psychiatric:        Mood and Affect: Mood normal.        Behavior: Behavior normal.        Thought Content: Thought content normal.        Judgment: Judgment normal.     Results for orders placed or performed in visit on 05/03/22  Comp Met (CMET)  Result Value Ref Range   Glucose 148 (H) 70 - 99 mg/dL   BUN 23 6 - 24 mg/dL    Creatinine, Ser 1.50 (H) 0.76 - 1.27 mg/dL   eGFR 59 (L) >59 mL/min/1.73   BUN/Creatinine Ratio 15 9 - 20   Sodium 145 (H) 134 - 144 mmol/L   Potassium 3.4 (L) 3.5 - 5.2 mmol/L   Chloride 104 96 - 106 mmol/L   CO2 25 20 - 29 mmol/L   Calcium 9.3 8.7 - 10.2 mg/dL   Total Protein 6.6 6.0 - 8.5 g/dL   Albumin 4.6 4.1 - 5.1 g/dL   Globulin, Total 2.0 1.5 - 4.5 g/dL   Albumin/Globulin Ratio 2.3 (H) 1.2 - 2.2   Bilirubin Total 0.7 0.0 - 1.2 mg/dL   Alkaline Phosphatase 80 44 - 121 IU/L   AST 16 0 - 40 IU/L   ALT 19 0 - 44 IU/L  HgB A1c  Result Value Ref Range   Hgb A1c MFr Bld 5.9 (H) 4.8 - 5.6 %   Est. average glucose Bld gHb Est-mCnc 123 mg/dL      Assessment & Plan:   Problem List Items Addressed This Visit       Cardiovascular and Mediastinum   Primary hypertension    Chronic. Not well controlled.  Patient has not been taking Hydralazine 72m TID due to working.  Will change to Hydralazine 512mBID to help with compliance.  Will follow up in 1 month for reevaluation.  Call sooner if concerns arise.        Relevant Medications   valsartan-hydrochlorothiazide (DIOVAN-HCT) 160-12.5 MG tablet   pravastatin (PRAVACHOL) 20 MG tablet   spironolactone (ALDACTONE) 25 MG tablet   hydrALAZINE (APRESOLINE) 25 MG tablet   amLODipine (NORVASC) 10 MG tablet   Other Relevant Orders   Comp Met (CMET)     Endocrine   Controlled type 2 diabetes mellitus with complication, without  long-term current use of insulin (HCC)    Chronic. Controlled.  Has been out of Metformin for 2 weeks.  Refilled today.  Encouraged him to continue with weight loss. Labs ordered today.  Will make recommendations based on lab results.   Follow up in 3 months for reevaluation.  Call sooner if concerns arise.       Relevant Medications   metformin (FORTAMET) 500 MG (OSM) 24 hr tablet   valsartan-hydrochlorothiazide (DIOVAN-HCT) 160-12.5 MG tablet   pravastatin (PRAVACHOL) 20 MG tablet   Other Relevant Orders    HgB A1c   Urine Microalbumin w/creat. ratio     Genitourinary   Chronic kidney disease, stage 3a (HCC)    Chronic. Secondary to uncontrolled HTN.  Blood pressure not well controlled on Amlodipine, Valsartan, HCTZ and hydralazine.  Would benefit from SGLT2/GLP1.  Not able to afford it at this time.  States he is taking the Spirnolactone.  Will recheck CMP today to evaluate Potassium.        Other   Hyperlipidemia    Chronic.  Controlled.  Continue with current medication regimen of Pravastatin.  Refill sent today.  Labs ordered today.  Return to clinic in 6 months for reevaluation.  Call sooner if concerns arise.        Relevant Medications   valsartan-hydrochlorothiazide (DIOVAN-HCT) 160-12.5 MG tablet   pravastatin (PRAVACHOL) 20 MG tablet   spironolactone (ALDACTONE) 25 MG tablet   hydrALAZINE (APRESOLINE) 25 MG tablet   amLODipine (NORVASC) 10 MG tablet   Other Visit Diagnoses     Annual physical exam    -  Primary   Health maintenance reviewed during visit today.  Labs ordered. Pneumonia and Flu given. Declined Colonoscopy.   Relevant Orders   CBC w/Diff   Lipid Profile   TSH   Gallbladder polyp       Relevant Orders   US Abdomen Limited RUQ (LIVER/GB)   Screening for HIV (human immunodeficiency virus)       Relevant Orders   HIV Antibody (routine testing w rflx)   Encounter for hepatitis C screening test for low risk patient       Relevant Orders   Hepatitis C Antibody   Need for pneumococcal vaccination       Relevant Orders   Pneumococcal polysaccharide vaccine 23-valent greater than or equal to 2yo subcutaneous/IM   Need for influenza vaccination       Relevant Orders   Flu Vaccine QUAD 6+ mos PF IM (Fluarix Quad PF)        Discussed aspirin prophylaxis for myocardial infarction prevention and decision was it was not indicated  LABORATORY TESTING:  Health maintenance labs ordered today as discussed above.    IMMUNIZATIONS:   - Tdap: Tetanus vaccination  status reviewed: Will get at next visit. - Influenza: Administered today - Pneumovax: Administered today - Prevnar: Not applicable - COVID: Not applicable - HPV: Not applicable - Shingrix vaccine: Not applicable  SCREENING: - Colonoscopy: Refused  Discussed with patient purpose of the colonoscopy is to detect colon cancer at curable precancerous or early stages   - AAA Screening: Not applicable  -Hearing Test: Not applicable  -Spirometry: Not applicable   PATIENT COUNSELING:    Sexuality: Discussed sexually transmitted diseases, partner selection, use of condoms, avoidance of unintended pregnancy  and contraceptive alternatives.   Advised to avoid cigarette smoking.  I discussed with the patient that most people either abstain from alcohol or drink within safe limits (<=14/week and <=4 drinks/occasion  for males, <=7/weeks and <= 3 drinks/occasion for females) and that the risk for alcohol disorders and other health effects rises proportionally with the number of drinks per week and how often a drinker exceeds daily limits.  Discussed cessation/primary prevention of drug use and availability of treatment for abuse.   Diet: Encouraged to adjust caloric intake to maintain  or achieve ideal body weight, to reduce intake of dietary saturated fat and total fat, to limit sodium intake by avoiding high sodium foods and not adding table salt, and to maintain adequate dietary potassium and calcium preferably from fresh fruits, vegetables, and low-fat dairy products.    stressed the importance of regular exercise  Injury prevention: Discussed safety belts, safety helmets, smoke detector, smoking near bedding or upholstery.   Dental health: Discussed importance of regular tooth brushing, flossing, and dental visits.   Follow up plan: NEXT PREVENTATIVE PHYSICAL DUE IN 1 YEAR. Return in about 1 month (around 09/09/2022).

## 2022-08-09 NOTE — Assessment & Plan Note (Signed)
Chronic. Not well controlled.  Patient has not been taking Hydralazine 25mg  TID due to working.  Will change to Hydralazine 50mg  BID to help with compliance.  Will follow up in 1 month for reevaluation.  Call sooner if concerns arise.

## 2022-08-10 LAB — COMPREHENSIVE METABOLIC PANEL
ALT: 34 IU/L (ref 0–44)
AST: 18 IU/L (ref 0–40)
Albumin/Globulin Ratio: 2.4 — ABNORMAL HIGH (ref 1.2–2.2)
Albumin: 4.5 g/dL (ref 4.1–5.1)
Alkaline Phosphatase: 92 IU/L (ref 44–121)
BUN/Creatinine Ratio: 18 (ref 9–20)
BUN: 26 mg/dL — ABNORMAL HIGH (ref 6–24)
Bilirubin Total: 0.7 mg/dL (ref 0.0–1.2)
CO2: 22 mmol/L (ref 20–29)
Calcium: 9.1 mg/dL (ref 8.7–10.2)
Chloride: 100 mmol/L (ref 96–106)
Creatinine, Ser: 1.44 mg/dL — ABNORMAL HIGH (ref 0.76–1.27)
Globulin, Total: 1.9 g/dL (ref 1.5–4.5)
Glucose: 238 mg/dL — ABNORMAL HIGH (ref 70–99)
Potassium: 3.1 mmol/L — ABNORMAL LOW (ref 3.5–5.2)
Sodium: 139 mmol/L (ref 134–144)
Total Protein: 6.4 g/dL (ref 6.0–8.5)
eGFR: 61 mL/min/{1.73_m2} (ref >59)

## 2022-08-10 LAB — CBC WITH DIFFERENTIAL/PLATELET
Basophils Absolute: 0.1 10*3/uL (ref 0.0–0.2)
Basos: 1 %
EOS (ABSOLUTE): 0.2 10*3/uL (ref 0.0–0.4)
Eos: 2 %
Hematocrit: 48.3 % (ref 37.5–51.0)
Hemoglobin: 16.1 g/dL (ref 13.0–17.7)
Immature Grans (Abs): 0 10*3/uL (ref 0.0–0.1)
Immature Granulocytes: 0 %
Lymphocytes Absolute: 1.8 10*3/uL (ref 0.7–3.1)
Lymphs: 27 %
MCH: 28 pg (ref 26.6–33.0)
MCHC: 33.3 g/dL (ref 31.5–35.7)
MCV: 84 fL (ref 79–97)
Monocytes Absolute: 0.4 10*3/uL (ref 0.1–0.9)
Monocytes: 6 %
Neutrophils Absolute: 4.3 10*3/uL (ref 1.4–7.0)
Neutrophils: 64 %
Platelets: 194 10*3/uL (ref 150–450)
RBC: 5.74 x10E6/uL (ref 4.14–5.80)
RDW: 13.2 % (ref 11.6–15.4)
WBC: 6.7 10*3/uL (ref 3.4–10.8)

## 2022-08-10 LAB — LIPID PANEL
Chol/HDL Ratio: 3.7 ratio (ref 0.0–5.0)
Cholesterol, Total: 153 mg/dL (ref 100–199)
HDL: 41 mg/dL (ref >39)
LDL Chol Calc (NIH): 67 mg/dL (ref 0–99)
Triglycerides: 280 mg/dL — ABNORMAL HIGH (ref 0–149)
VLDL Cholesterol Cal: 45 mg/dL — ABNORMAL HIGH (ref 5–40)

## 2022-08-10 LAB — HEMOGLOBIN A1C
Est. average glucose Bld gHb Est-mCnc: 128 mg/dL
Hgb A1c MFr Bld: 6.1 % — ABNORMAL HIGH (ref 4.8–5.6)

## 2022-08-10 LAB — HEPATITIS C ANTIBODY: Hep C Virus Ab: NONREACTIVE

## 2022-08-10 LAB — TSH: TSH: 1.36 u[IU]/mL (ref 0.450–4.500)

## 2022-08-10 LAB — HIV ANTIBODY (ROUTINE TESTING W REFLEX): HIV Screen 4th Generation wRfx: NONREACTIVE

## 2022-08-10 NOTE — Progress Notes (Signed)
Please let patient know that his lab work looks good.  Kidney function remains stable.  A1c is well controlled at 6.1.  Continue with current medication regimen.  Follow up as discussed.

## 2022-08-16 ENCOUNTER — Ambulatory Visit
Admission: RE | Admit: 2022-08-16 | Discharge: 2022-08-16 | Disposition: A | Discharge status: Home / Self Care | Payer: 59 | Source: Ambulatory Visit | Attending: Nurse Practitioner | Admitting: Nurse Practitioner

## 2022-08-16 DIAGNOSIS — K824 Cholesterolosis of gallbladder: Secondary | ICD-10-CM | POA: Diagnosis not present

## 2022-08-16 DIAGNOSIS — R1011 Right upper quadrant pain: Secondary | ICD-10-CM | POA: Diagnosis not present

## 2022-08-17 NOTE — Progress Notes (Signed)
Please let patient know that the gallbladder polyp decreased in size which is great news.   There was a mass seen on his liver.  The radiologist is not able to determine whether we should be concerned for cancer or not so they are recommending further imaging with an MRI.  If he agrees I will go ahead and place the order.

## 2022-08-18 NOTE — Progress Notes (Signed)
Order placed for MRI

## 2022-08-18 NOTE — Addendum Note (Signed)
Addended by: Jon Billings on: 08/18/2022 10:33 AM   Modules accepted: Orders

## 2022-08-31 ENCOUNTER — Other Ambulatory Visit: Payer: Self-pay | Admitting: Nurse Practitioner

## 2022-08-31 NOTE — Telephone Encounter (Signed)
Rx was sent to different pharmacy on 08/09/22 #90/1  Requested Prescriptions  Refused Prescriptions Disp Refills   spironolactone (ALDACTONE) 25 MG tablet [Pharmacy Med Name: SPIRONOLACTONE 25MG TABLETS] 90 tablet 1    Sig: TAKE 1 TABLET(25 MG) BY MOUTH DAILY     Cardiovascular: Diuretics - Aldosterone Antagonist Failed - 08/31/2022  3:29 AM      Failed - Cr in normal range and within 180 days    Creatinine, Ser  Date Value Ref Range Status  08/09/2022 1.44 (H) 0.76 - 1.27 mg/dL Final         Failed - K in normal range and within 180 days    Potassium  Date Value Ref Range Status  08/09/2022 3.1 (L) 3.5 - 5.2 mmol/L Final         Failed - Last BP in normal range    BP Readings from Last 1 Encounters:  08/09/22 (!) 137/92         Passed - Na in normal range and within 180 days    Sodium  Date Value Ref Range Status  08/09/2022 139 134 - 144 mmol/L Final         Passed - eGFR is 30 or above and within 180 days    eGFR  Date Value Ref Range Status  08/09/2022 61 >59 mL/min/1.73 Final         Passed - Valid encounter within last 6 months    Recent Outpatient Visits           3 weeks ago Annual physical exam   [facility] Holdsworth, Karen, NP   4 months ago Controlled type 2 diabetes mellitus with complication, without long-term current use of insulin (HCC)   [facility] Holdsworth, Karen, NP   6 months ago Controlled type 2 diabetes mellitus with complication, without long-term current use of insulin (HCC)   [facility] Holdsworth, Karen, NP   7 months ago Primary hypertension   [facility] Holdsworth, Karen, NP   9 months ago Chronic kidney disease, stage 3a (HCC)   [facility] Holdsworth, Karen, NP       Future Appointments             In 2 weeks Holdsworth, Karen, NP [facility]              

## 2022-09-02 ENCOUNTER — Telehealth: Payer: Self-pay | Admitting: Nurse Practitioner

## 2022-09-02 NOTE — Telephone Encounter (Signed)
Dawn called in  from precert, states patient needs auth to have MRI done. Ext [phone] with phone #.

## 2022-09-06 ENCOUNTER — Ambulatory Visit
Admission: RE | Admit: 2022-09-06 | Discharge: 2022-09-06 | Disposition: A | Discharge status: Home / Self Care | Payer: 59 | Source: Ambulatory Visit | Attending: Nurse Practitioner | Admitting: Nurse Practitioner

## 2022-09-06 DIAGNOSIS — N281 Cyst of kidney, acquired: Secondary | ICD-10-CM | POA: Diagnosis not present

## 2022-09-06 DIAGNOSIS — N261 Atrophy of kidney (terminal): Secondary | ICD-10-CM | POA: Diagnosis not present

## 2022-09-06 DIAGNOSIS — R160 Hepatomegaly, not elsewhere classified: Secondary | ICD-10-CM | POA: Insufficient documentation

## 2022-09-06 DIAGNOSIS — Z8505 Personal history of malignant neoplasm of liver: Secondary | ICD-10-CM | POA: Diagnosis not present

## 2022-09-06 DIAGNOSIS — K769 Liver disease, unspecified: Secondary | ICD-10-CM | POA: Diagnosis not present

## 2022-09-06 MED ORDER — GADOBUTROL 1 MMOL/ML IV SOLN
9.0000 mL | Freq: Once | INTRAVENOUS | Status: AC | PRN
  Administered 2022-09-06: 9 mL via INTRAVENOUS

## 2022-09-12 ENCOUNTER — Other Ambulatory Visit: Payer: Self-pay | Admitting: Nurse Practitioner

## 2022-09-12 DIAGNOSIS — N1831 Chronic kidney disease, stage 3a: Secondary | ICD-10-CM | POA: Diagnosis not present

## 2022-09-12 DIAGNOSIS — E876 Hypokalemia: Secondary | ICD-10-CM | POA: Diagnosis not present

## 2022-09-12 DIAGNOSIS — E1122 Type 2 diabetes mellitus with diabetic chronic kidney disease: Secondary | ICD-10-CM | POA: Diagnosis not present

## 2022-09-12 DIAGNOSIS — N261 Atrophy of kidney (terminal): Secondary | ICD-10-CM | POA: Diagnosis not present

## 2022-09-12 DIAGNOSIS — I10 Essential (primary) hypertension: Secondary | ICD-10-CM | POA: Diagnosis not present

## 2022-09-12 DIAGNOSIS — Q615 Medullary cystic kidney: Secondary | ICD-10-CM

## 2022-09-12 DIAGNOSIS — N2889 Other specified disorders of kidney and ureter: Secondary | ICD-10-CM

## 2022-09-12 DIAGNOSIS — R600 Localized edema: Secondary | ICD-10-CM | POA: Diagnosis not present

## 2022-09-12 DIAGNOSIS — R809 Proteinuria, unspecified: Secondary | ICD-10-CM | POA: Diagnosis not present

## 2022-09-12 NOTE — Progress Notes (Signed)
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  It is not concerning.  He also has a nodule on his liver that is not concerns at this time.  The radiologist recommends an MRI with contrast in 3-6 months which I have already ordered.  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  I recommend he mention this to his nephrologist next time he sees them.

## 2022-09-19 NOTE — Progress Notes (Unsigned)
There were no vitals taken for this visit.   Subjective:    Patient ID: Jason King, male    DOB: Aug 09, 1976, 46 y.o.   MRN: [id number]  HPI: Jason King is a 46 y.o. male  No chief complaint on file.  HYPERTENSION Hypertension status: controlled  Satisfied with current treatment? no Duration of hypertension: years BP monitoring frequency:  couple of times a week BP range: 150/90 BP medication side effects:  no Medication compliance: excellent compliance Previous BP meds: hydralazine, amlodipine, and valsartan-HCTZ, spirolactone- hypokalemia. Added by Nephrology Aspirin: no Recurrent headaches: no Visual changes: no Palpitations: no Dyspnea: no Chest pain: no Lower extremity edema: no Dizzy/lightheaded: no  CHRONIC KIDNEY DISEASE CKD status: controlled Medications renally dose: yes Previous renal evaluation:  Pneumovax:  Not up to Date Influenza Vaccine:  Not up to Date  GERD GERD control status: uncontrolled Satisfied with current treatment? no Heartburn frequency: several times a week Medication side effects: no  Medication compliance: worse Previous GERD medications: Antacid use frequency:  none Duration: all night Nature: burning sensation Location: epigastric Heartburn duration: years Alleviatiating factors:  hasn't taken anything Aggravating factors: worse at night when he lays down Dysphagia: no Odynophagia:  no Hematemesis: no Blood in stool: no EGD: no   Relevant past medical, surgical, family and social history reviewed and updated as indicated. Interim medical history since our last visit reviewed. Allergies and medications reviewed and updated.  Review of Systems  Eyes:  Negative for visual disturbance.  Respiratory:  Negative for shortness of breath.   Cardiovascular:  Negative for chest pain and leg swelling.  Gastrointestinal:  Positive for abdominal pain.  Neurological:  Negative for light-headedness and headaches.    Per HPI  unless specifically indicated above     Objective:    There were no vitals taken for this visit.  Wt Readings from Last 3 Encounters:  08/09/22 188 lb (85.3 kg)  05/03/22 179 lb 3.2 oz (81.3 kg)  03/01/22 176 lb (79.8 kg)    Physical Exam Vitals and nursing note reviewed.  Constitutional:      General: He is not in acute distress.    Appearance: Normal appearance. He is not ill-appearing, toxic-appearing or diaphoretic.  HENT:     Head: Normocephalic.     Right Ear: External ear normal.     Left Ear: External ear normal.     Nose: Nose normal. No congestion or rhinorrhea.     Mouth/Throat:     Mouth: Mucous membranes are moist.  Eyes:     General:        Right eye: No discharge.        Left eye: No discharge.     Extraocular Movements: Extraocular movements intact.     Conjunctiva/sclera: Conjunctivae normal.     Pupils: Pupils are equal, round, and reactive to light.  Cardiovascular:     Rate and Rhythm: Normal rate and regular rhythm.     Heart sounds: No murmur heard. Pulmonary:     Effort: Pulmonary effort is normal. No respiratory distress.     Breath sounds: Normal breath sounds. No wheezing, rhonchi or rales.  Abdominal:     General: Abdomen is flat. Bowel sounds are normal.  Musculoskeletal:     Cervical back: Normal range of motion and neck supple.  Skin:    General: Skin is warm and dry.     Capillary Refill: Capillary refill takes less than 2 seconds.  Neurological:  General: No focal deficit present.     Mental Status: He is alert and oriented to person, place, and time.  Psychiatric:        Mood and Affect: Mood normal.        Behavior: Behavior normal.        Thought Content: Thought content normal.        Judgment: Judgment normal.    Results for orders placed or performed in visit on 08/09/22  Comp Met (CMET)  Result Value Ref Range   Glucose 238 (H) 70 - 99 mg/dL   BUN 26 (H) 6 - 24 mg/dL   Creatinine, Ser 1.44 (H) 0.76 - 1.27 mg/dL    eGFR 61 >59 mL/min/1.73   BUN/Creatinine Ratio 18 9 - 20   Sodium 139 134 - 144 mmol/L   Potassium 3.1 (L) 3.5 - 5.2 mmol/L   Chloride 100 96 - 106 mmol/L   CO2 22 20 - 29 mmol/L   Calcium 9.1 8.7 - 10.2 mg/dL   Total Protein 6.4 6.0 - 8.5 g/dL   Albumin 4.5 4.1 - 5.1 g/dL   Globulin, Total 1.9 1.5 - 4.5 g/dL   Albumin/Globulin Ratio 2.4 (H) 1.2 - 2.2   Bilirubin Total 0.7 0.0 - 1.2 mg/dL   Alkaline Phosphatase 92 44 - 121 IU/L   AST 18 0 - 40 IU/L   ALT 34 0 - 44 IU/L  HgB A1c  Result Value Ref Range   Hgb A1c MFr Bld 6.1 (H) 4.8 - 5.6 %   Est. average glucose Bld gHb Est-mCnc 128 mg/dL  HIV Antibody (routine testing w rflx)  Result Value Ref Range   HIV Screen 4th Generation wRfx Non Reactive Non Reactive  Hepatitis C Antibody  Result Value Ref Range   Hep C Virus Ab Non Reactive Non Reactive  CBC w/Diff  Result Value Ref Range   WBC 6.7 3.4 - 10.8 x10E3/uL   RBC 5.74 4.14 - 5.80 x10E6/uL   Hemoglobin 16.1 13.0 - 17.7 g/dL   Hematocrit 48.3 37.5 - 51.0 %   MCV 84 79 - 97 fL   MCH 28.0 26.6 - 33.0 pg   MCHC 33.3 31.5 - 35.7 g/dL   RDW 13.2 11.6 - 15.4 %   Platelets 194 150 - 450 x10E3/uL   Neutrophils 64 Not Estab. %   Lymphs 27 Not Estab. %   Monocytes 6 Not Estab. %   Eos 2 Not Estab. %   Basos 1 Not Estab. %   Neutrophils Absolute 4.3 1.4 - 7.0 x10E3/uL   Lymphocytes Absolute 1.8 0.7 - 3.1 x10E3/uL   Monocytes Absolute 0.4 0.1 - 0.9 x10E3/uL   EOS (ABSOLUTE) 0.2 0.0 - 0.4 x10E3/uL   Basophils Absolute 0.1 0.0 - 0.2 x10E3/uL   Immature Granulocytes 0 Not Estab. %   Immature Grans (Abs) 0.0 0.0 - 0.1 x10E3/uL  Lipid Profile  Result Value Ref Range   Cholesterol, Total 153 100 - 199 mg/dL   Triglycerides 280 (H) 0 - 149 mg/dL   HDL 41 >39 mg/dL   VLDL Cholesterol Cal 45 (H) 5 - 40 mg/dL   LDL Chol Calc (NIH) 67 0 - 99 mg/dL   Chol/HDL Ratio 3.7 0.0 - 5.0 ratio  TSH  Result Value Ref Range   TSH 1.360 0.450 - 4.500 uIU/mL  Microalbumin, Urine Waived  Result  Value Ref Range   Microalb, Ur Waived 150 (H) 0 - 19 mg/L   Creatinine, Urine Waived 100 10 - 300  mg/dL   Microalb/Creat Ratio >300 (H) <30 mg/g      Assessment & Plan:   Problem List Items Addressed This Visit      Cardiovascular and Mediastinum   Primary hypertension - Primary     Endocrine   Controlled type 2 diabetes mellitus with complication, without long-term current use of insulin (HCC)     Genitourinary   Chronic kidney disease, stage 3a ([facility])     Other   Hyperlipidemia     Follow up plan: No follow-ups on file.

## 2022-09-20 ENCOUNTER — Encounter: Payer: Self-pay | Admitting: Nurse Practitioner

## 2022-09-20 ENCOUNTER — Ambulatory Visit: Payer: 59 | Admitting: Nurse Practitioner

## 2022-09-20 VITALS — BP 125/81 | HR 67 | Temp 97.9°F | Wt 186.8 lb

## 2022-09-20 DIAGNOSIS — N1831 Chronic kidney disease, stage 3a: Secondary | ICD-10-CM | POA: Diagnosis not present

## 2022-09-20 DIAGNOSIS — E785 Hyperlipidemia, unspecified: Secondary | ICD-10-CM

## 2022-09-20 DIAGNOSIS — I10 Essential (primary) hypertension: Secondary | ICD-10-CM

## 2022-09-20 DIAGNOSIS — E876 Hypokalemia: Secondary | ICD-10-CM

## 2022-09-20 DIAGNOSIS — E118 Type 2 diabetes mellitus with unspecified complications: Secondary | ICD-10-CM

## 2022-09-20 MED ORDER — SPIRONOLACTONE 50 MG PO TABS: 50.0000 mg | ORAL_TABLET | Freq: Every day | ORAL | 1 refills | Status: DC

## 2022-09-20 NOTE — Assessment & Plan Note (Signed)
Chronic.  Controlled.  Continue with current medication regimen of Amlodipine, Hydralazine, Aldactone, Valsartan/HCTZ.  Labs ordered today.  Return to clinic in 2 months for reevaluation.  Call sooner if concerns arise.

## 2022-09-20 NOTE — Assessment & Plan Note (Signed)
Chronic.  Ongoing concern.  Nephrologist increase Aldactone to 50mg .  Patient did not increase dose.  New prescription written for patient today.  CMP rechecked.

## 2022-09-20 NOTE — Assessment & Plan Note (Signed)
Chronic. Nephrologist would like patient on Farxiga.  Will try to call insurance company for patient to find out coverage.

## 2022-09-21 LAB — COMPREHENSIVE METABOLIC PANEL
ALT: 36 IU/L (ref 0–44)
AST: 21 IU/L (ref 0–40)
Albumin/Globulin Ratio: 2 (ref 1.2–2.2)
Albumin: 4.6 g/dL (ref 4.1–5.1)
Alkaline Phosphatase: 84 IU/L (ref 44–121)
BUN/Creatinine Ratio: 18 (ref 9–20)
BUN: 27 mg/dL — ABNORMAL HIGH (ref 6–24)
Bilirubin Total: 0.5 mg/dL (ref 0.0–1.2)
CO2: 22 mmol/L (ref 20–29)
Calcium: 9.3 mg/dL (ref 8.7–10.2)
Chloride: 100 mmol/L (ref 96–106)
Creatinine, Ser: 1.49 mg/dL — ABNORMAL HIGH (ref 0.76–1.27)
Globulin, Total: 2.3 g/dL (ref 1.5–4.5)
Glucose: 115 mg/dL — ABNORMAL HIGH (ref 70–99)
Potassium: 3.3 mmol/L — ABNORMAL LOW (ref 3.5–5.2)
Sodium: 143 mmol/L (ref 134–144)
Total Protein: 6.9 g/dL (ref 6.0–8.5)
eGFR: 59 mL/min/{1.73_m2} — ABNORMAL LOW (ref >59)

## 2022-09-21 NOTE — Progress Notes (Signed)
Please let patient know that his Kidney function remains stable.  His potassium is still low.  Remind him to pick up the increased dose of Spirolactone at the pharmacy.  It should be 50mg .

## 2022-10-04 ENCOUNTER — Other Ambulatory Visit: Payer: 59

## 2022-10-04 DIAGNOSIS — E876 Hypokalemia: Secondary | ICD-10-CM | POA: Diagnosis not present

## 2022-10-05 LAB — COMPREHENSIVE METABOLIC PANEL
ALT: 36 IU/L (ref 0–44)
AST: 18 IU/L (ref 0–40)
Albumin/Globulin Ratio: 2 (ref 1.2–2.2)
Albumin: 4.6 g/dL (ref 4.1–5.1)
Alkaline Phosphatase: 90 IU/L (ref 44–121)
BUN/Creatinine Ratio: 15 (ref 9–20)
BUN: 24 mg/dL (ref 6–24)
Bilirubin Total: 0.7 mg/dL (ref 0.0–1.2)
CO2: 24 mmol/L (ref 20–29)
Calcium: 9.3 mg/dL (ref 8.7–10.2)
Chloride: 102 mmol/L (ref 96–106)
Creatinine, Ser: 1.56 mg/dL — ABNORMAL HIGH (ref 0.76–1.27)
Globulin, Total: 2.3 g/dL (ref 1.5–4.5)
Glucose: 118 mg/dL — ABNORMAL HIGH (ref 70–99)
Potassium: 3.8 mmol/L (ref 3.5–5.2)
Sodium: 141 mmol/L (ref 134–144)
Total Protein: 6.9 g/dL (ref 6.0–8.5)
eGFR: 55 mL/min/{1.73_m2} — ABNORMAL LOW (ref >59)

## 2022-10-05 NOTE — Progress Notes (Signed)
Please let patient know that his potassium is back within normal range.  He needs to continue to take Spirolactone 50mg  daily.

## 2022-10-11 DIAGNOSIS — E119 Type 2 diabetes mellitus without complications: Secondary | ICD-10-CM | POA: Diagnosis not present

## 2022-10-11 DIAGNOSIS — I10 Essential (primary) hypertension: Secondary | ICD-10-CM | POA: Diagnosis not present

## 2022-10-11 DIAGNOSIS — K219 Gastro-esophageal reflux disease without esophagitis: Secondary | ICD-10-CM | POA: Diagnosis not present

## 2022-10-11 DIAGNOSIS — E785 Hyperlipidemia, unspecified: Secondary | ICD-10-CM | POA: Diagnosis not present

## 2022-10-11 DIAGNOSIS — Z833 Family history of diabetes mellitus: Secondary | ICD-10-CM | POA: Diagnosis not present

## 2022-10-11 DIAGNOSIS — Z7984 Long term (current) use of oral hypoglycemic drugs: Secondary | ICD-10-CM | POA: Diagnosis not present

## 2022-10-11 DIAGNOSIS — Z8249 Family history of ischemic heart disease and other diseases of the circulatory system: Secondary | ICD-10-CM | POA: Diagnosis not present

## 2022-10-30 ENCOUNTER — Other Ambulatory Visit: Payer: Self-pay | Admitting: Nurse Practitioner

## 2022-10-31 NOTE — Telephone Encounter (Signed)
Requested Prescriptions  Pending Prescriptions Disp Refills   hydrALAZINE (APRESOLINE) 25 MG tablet [Pharmacy Med Name: HYDRALAZINE 25 MG TABLET] 360 tablet 2    Sig: TAKE 2 TABLETS BY MOUTH 2 TIMES DAILY.     Cardiovascular:  Vasodilators Failed - 10/30/2022  8:48 AM      Failed - ANA Screen, Ifa, Serum in normal range and within 360 days    No results found for: "ANA", "ANATITER", "LABANTI"       Passed - HCT in normal range and within 360 days    Hematocrit  Date Value Ref Range Status  08/09/2022 48.3 37.5 - 51.0 % Final         Passed - HGB in normal range and within 360 days    Hemoglobin  Date Value Ref Range Status  08/09/2022 16.1 13.0 - 17.7 g/dL Final         Passed - RBC in normal range and within 360 days    RBC  Date Value Ref Range Status  08/09/2022 5.74 4.14 - 5.80 x10E6/uL Final         Passed - WBC in normal range and within 360 days    WBC  Date Value Ref Range Status  08/09/2022 6.7 3.4 - 10.8 x10E3/uL Final         Passed - PLT in normal range and within 360 days    Platelets  Date Value Ref Range Status  08/09/2022 194 150 - 450 x10E3/uL Final         Passed - Last BP in normal range    BP Readings from Last 1 Encounters:  09/20/22 125/81         Passed - Valid encounter within last 12 months    Recent Outpatient Visits           1 month ago Primary hypertension   [place], Karen, NP   2 months ago Annual physical exam   [facility] Jon Billings, NP   6 months ago Controlled type 2 diabetes mellitus with complication, without long-term current use of insulin ([place])   [facility] Jon Billings, NP   8 months ago Controlled type 2 diabetes mellitus with complication, without long-term current use of insulin ([place])   [facility] Jon Billings, NP   9 months ago Primary hypertension   [facility] Jon Billings, NP       Future Appointments              In 3 weeks Jon Billings, NP [facility], [place]

## 2022-11-22 ENCOUNTER — Other Ambulatory Visit: Payer: Self-pay | Admitting: Nurse Practitioner

## 2022-11-22 ENCOUNTER — Encounter: Payer: Self-pay | Admitting: Nurse Practitioner

## 2022-11-22 ENCOUNTER — Ambulatory Visit (INDEPENDENT_AMBULATORY_CARE_PROVIDER_SITE_OTHER): Payer: 59 | Admitting: Nurse Practitioner

## 2022-11-22 VITALS — BP 132/80 | HR 83 | Temp 97.7°F | Wt 195.1 lb

## 2022-11-22 DIAGNOSIS — I10 Essential (primary) hypertension: Secondary | ICD-10-CM | POA: Diagnosis not present

## 2022-11-22 DIAGNOSIS — E785 Hyperlipidemia, unspecified: Secondary | ICD-10-CM

## 2022-11-22 DIAGNOSIS — E118 Type 2 diabetes mellitus with unspecified complications: Secondary | ICD-10-CM

## 2022-11-22 DIAGNOSIS — N1831 Chronic kidney disease, stage 3a: Secondary | ICD-10-CM

## 2022-11-22 MED ORDER — PRAVASTATIN SODIUM 20 MG PO TABS: 20.0000 mg | ORAL_TABLET | Freq: Every day | ORAL | 1 refills | Status: DC

## 2022-11-22 MED ORDER — VALSARTAN-HYDROCHLOROTHIAZIDE 160-12.5 MG PO TABS: 1.0000 | ORAL_TABLET | Freq: Every day | ORAL | 1 refills | Status: DC

## 2022-11-22 MED ORDER — HYDRALAZINE HCL 25 MG PO TABS: 50.0000 mg | ORAL_TABLET | Freq: Two times a day (BID) | ORAL | 1 refills | Status: DC

## 2022-11-22 MED ORDER — AMLODIPINE BESYLATE 10 MG PO TABS: 10.0000 mg | ORAL_TABLET | Freq: Every day | ORAL | 1 refills | Status: DC

## 2022-11-22 MED ORDER — SPIRONOLACTONE 50 MG PO TABS: 50.0000 mg | ORAL_TABLET | Freq: Every day | ORAL | 1 refills | Status: DC

## 2022-11-22 MED ORDER — OMEPRAZOLE 20 MG PO CPDR: 20.0000 mg | DELAYED_RELEASE_CAPSULE | Freq: Every day | ORAL | 1 refills | Status: DC

## 2022-11-22 MED ORDER — METFORMIN HCL ER (OSM) 500 MG PO TB24: 500.0000 mg | ORAL_TABLET | Freq: Every day | ORAL | 1 refills | Status: DC

## 2022-11-22 NOTE — Assessment & Plan Note (Signed)
Chronic.  Controlled.  Continue with current medication regimen of Amlodipine, Hydralazine, Aldactone, Valsartan/HCTZ.  Labs ordered today.  Return to clinic in 3 months for reevaluation.  Call sooner if concerns arise.

## 2022-11-22 NOTE — Assessment & Plan Note (Signed)
Chronic. Controlled.   Will refer to CCM to try to get Farxiga for patient.  Encouraged him to continue with weight loss. Labs ordered today.  Will make recommendations based on lab results.   Follow up in 3 months for reevaluation.  Call sooner if concerns arise.

## 2022-11-22 NOTE — Progress Notes (Signed)
BP 132/80 (BP Location: Right Arm, Cuff Size: Normal)   Pulse 83   Temp 97.7 F (36.5 C) (Oral)   Wt 195 lb 1.6 oz (88.5 kg)   SpO2 98%   BMI 29.66 kg/m    Subjective:    Patient ID: Jason King, male    DOB: Nov 10, 1975, 47 y.o.   MRN: [id number]  HPI: Jason King is a 47 y.o. male  Chief Complaint  Patient presents with   Hypertension   Diabetes   Hyperlipidemia   HYPERTENSION Hypertension status: controlled  Satisfied with current treatment? no Duration of hypertension: years BP monitoring frequency:  couple of times a week BP range: 130/80 BP medication side effects:  no Medication compliance: excellent compliance Previous BP meds: hydralazine, amlodipine, and valsartan-HCTZ, spirolactone- endorses taking spirnolactone Aspirin: no Recurrent headaches: no Visual changes: no Palpitations: no Dyspnea: no Chest pain: no Lower extremity edema: no Dizzy/lightheaded: no  CHRONIC KIDNEY DISEASE CKD status: controlled Medications renally dose: yes Previous renal evaluation:  Pneumovax:  Not up to Date Influenza Vaccine:  Not up to Date   GERD GERD control status: uncontrolled Satisfied with current treatment? no Heartburn frequency: several times a week Medication side effects: no  Medication compliance: worse Previous GERD medications: Antacid use frequency:  none Duration: all night Nature: burning sensation Location: epigastric Heartburn duration: years Alleviatiating factors:  hasn't taken anything Aggravating factors: worse at night when he lays down Dysphagia: no Odynophagia:  no Hematemesis: no Blood in stool: no EGD: no   DIABETES Hypoglycemic episodes:no Polydipsia/polyuria: no Visual disturbance: no Chest pain: no Paresthesias: no Glucose Monitoring: no             Accucheck frequency: Not Checking             Fasting glucose:             Post prandial:             Evening:             Before meals: Taking Insulin?: no              Long acting insulin:             Short acting insulin: Blood Pressure Monitoring: not checking Retinal Examination: Not up to Date Foot Exam: Up to Date Diabetic Education: Not Completed Pneumovax: Up to Date Influenza: Up to Date Aspirin: no     Relevant past medical, surgical, family and social history reviewed and updated as indicated. Interim medical history since our last visit reviewed. Allergies and medications reviewed and updated.  Review of Systems  Eyes:  Negative for visual disturbance.  Respiratory:  Negative for shortness of breath.   Cardiovascular:  Negative for chest pain and leg swelling.  Neurological:  Negative for light-headedness and headaches.    Per HPI unless specifically indicated above     Objective:    BP 132/80 (BP Location: Right Arm, Cuff Size: Normal)   Pulse 83   Temp 97.7 F (36.5 C) (Oral)   Wt 195 lb 1.6 oz (88.5 kg)   SpO2 98%   BMI 29.66 kg/m   Wt Readings from Last 3 Encounters:  11/22/22 195 lb 1.6 oz (88.5 kg)  09/20/22 186 lb 12.8 oz (84.7 kg)  08/09/22 188 lb (85.3 kg)    Physical Exam Vitals and nursing note reviewed.  Constitutional:      General: He is not in acute distress.    Appearance: Normal appearance. He  is not ill-appearing, toxic-appearing or diaphoretic.  HENT:     Head: Normocephalic.     Right Ear: External ear normal.     Left Ear: External ear normal.     Nose: Nose normal. No congestion or rhinorrhea.     Mouth/Throat:     Mouth: Mucous membranes are moist.  Eyes:     General:        Right eye: No discharge.        Left eye: No discharge.     Extraocular Movements: Extraocular movements intact.     Conjunctiva/sclera: Conjunctivae normal.     Pupils: Pupils are equal, round, and reactive to light.  Cardiovascular:     Rate and Rhythm: Normal rate and regular rhythm.     Heart sounds: No murmur heard. Pulmonary:     Effort: Pulmonary effort is normal. No respiratory distress.     Breath  sounds: Normal breath sounds. No wheezing, rhonchi or rales.  Abdominal:     General: Abdomen is flat. Bowel sounds are normal.  Musculoskeletal:     Cervical back: Normal range of motion and neck supple.  Skin:    General: Skin is warm and dry.     Capillary Refill: Capillary refill takes less than 2 seconds.  Neurological:     General: No focal deficit present.     Mental Status: He is alert and oriented to person, place, and time.  Psychiatric:        Mood and Affect: Mood normal.        Behavior: Behavior normal.        Thought Content: Thought content normal.        Judgment: Judgment normal.     Results for orders placed or performed in visit on 10/04/22  Comp Met (CMET)  Result Value Ref Range   Glucose 118 (H) 70 - 99 mg/dL   BUN 24 6 - 24 mg/dL   Creatinine, Ser 1.56 (H) 0.76 - 1.27 mg/dL   eGFR 55 (L) >59 mL/min/1.73   BUN/Creatinine Ratio 15 9 - 20   Sodium 141 134 - 144 mmol/L   Potassium 3.8 3.5 - 5.2 mmol/L   Chloride 102 96 - 106 mmol/L   CO2 24 20 - 29 mmol/L   Calcium 9.3 8.7 - 10.2 mg/dL   Total Protein 6.9 6.0 - 8.5 g/dL   Albumin 4.6 4.1 - 5.1 g/dL   Globulin, Total 2.3 1.5 - 4.5 g/dL   Albumin/Globulin Ratio 2.0 1.2 - 2.2   Bilirubin Total 0.7 0.0 - 1.2 mg/dL   Alkaline Phosphatase 90 44 - 121 IU/L   AST 18 0 - 40 IU/L   ALT 36 0 - 44 IU/L      Assessment & Plan:   Problem List Items Addressed This Visit       Cardiovascular and Mediastinum   Primary hypertension    Chronic.  Controlled.  Continue with current medication regimen of Amlodipine, Hydralazine, Aldactone, Valsartan/HCTZ.  Labs ordered today.  Return to clinic in 3 months for reevaluation.  Call sooner if concerns arise.        Relevant Medications   amLODipine (NORVASC) 10 MG tablet   hydrALAZINE (APRESOLINE) 25 MG tablet   pravastatin (PRAVACHOL) 20 MG tablet   spironolactone (ALDACTONE) 50 MG tablet   valsartan-hydrochlorothiazide (DIOVAN-HCT) 160-12.5 MG tablet   Other  Relevant Orders   AMB Referral to Pharmacy Medication Management     Endocrine   Controlled type 2 diabetes mellitus  with complication, without long-term current use of insulin (HCC) - Primary    Chronic. Controlled.   Will refer to CCM to try to get Farxiga for patient.  Encouraged him to continue with weight loss. Labs ordered today.  Will make recommendations based on lab results.   Follow up in 3 months for reevaluation.  Call sooner if concerns arise.       Relevant Medications   metformin (FORTAMET) 500 MG (OSM) 24 hr tablet   pravastatin (PRAVACHOL) 20 MG tablet   valsartan-hydrochlorothiazide (DIOVAN-HCT) 160-12.5 MG tablet   Other Relevant Orders   Comp Met (CMET)   HgB A1c   AMB Referral to Pharmacy Medication Management     Genitourinary   Chronic kidney disease, stage 3a (HCC)    Chronic. Nephrologist would like patient on Farxiga.  Will refer to CCM for assistance with medication.      Relevant Orders   Comp Met (CMET)   AMB Referral to Pharmacy Medication Management     Other   Hyperlipidemia    Chronic.  Controlled.  Continue with current medication regimen of Pravastatin.  Refill sent today.  Labs ordered today.  Return to clinic in 6 months for reevaluation.  Call sooner if concerns arise.        Relevant Medications   amLODipine (NORVASC) 10 MG tablet   hydrALAZINE (APRESOLINE) 25 MG tablet   pravastatin (PRAVACHOL) 20 MG tablet   spironolactone (ALDACTONE) 50 MG tablet   valsartan-hydrochlorothiazide (DIOVAN-HCT) 160-12.5 MG tablet     Follow up plan: Return in about 3 months (around 02/20/2023) for HTN, HLD, DM2 FU.

## 2022-11-22 NOTE — Assessment & Plan Note (Signed)
Chronic.  Controlled.  Continue with current medication regimen of Pravastatin.  Refill sent today.  Labs ordered today.  Return to clinic in 6 months for reevaluation.  Call sooner if concerns arise.

## 2022-11-22 NOTE — Assessment & Plan Note (Signed)
Chronic. Nephrologist would like patient on Farxiga.  Will refer to CCM for assistance with medication.

## 2022-11-23 NOTE — Telephone Encounter (Signed)
Requested medication (s) are due for refill today - no  Requested medication (s) are on the active medication list -yes  Future visit scheduled -yes  Last refill: 11/22/22  Notes to clinic: Pharmacy request: Alternative Rx  Requested Prescriptions  Pending Prescriptions Disp Refills   metFORMIN (GLUCOPHAGE) 500 MG tablet [Pharmacy Med Name: METFORMIN HCL 500 MG TABLET]  0     Endocrinology:  Diabetes - Biguanides Failed - 11/22/2022 10:38 AM      Failed - Cr in normal range and within 360 days    Creatinine, Ser  Date Value Ref Range Status  10/04/2022 1.56 (H) 0.76 - 1.27 mg/dL Final         Failed - eGFR in normal range and within 360 days    eGFR  Date Value Ref Range Status  10/04/2022 55 (L) >59 mL/min/1.73 Final         Failed - B12 Level in normal range and within 720 days    No results found for: "VITAMINB12"       Passed - HBA1C is between 0 and 7.9 and within 180 days    Hgb A1c MFr Bld  Date Value Ref Range Status  08/09/2022 6.1 (H) 4.8 - 5.6 % Final    Comment:             Prediabetes: 5.7 - 6.4          Diabetes: >6.4          Glycemic control for adults with diabetes: <7.0          Passed - Valid encounter within last 6 months    Recent Outpatient Visits           Yesterday Controlled type 2 diabetes mellitus with complication, without long-term current use of insulin ([place])   [place] Jon Billings, NP   2 months ago Primary hypertension   [place], Karen, NP   3 months ago Annual physical exam   [place] Jon Billings, NP   6 months ago Controlled type 2 diabetes mellitus with complication, without long-term current use of insulin ([place])   [place] Jon Billings, NP   8 months ago Controlled type 2 diabetes mellitus with complication, without long-term current use of insulin ([place])   [place]  Jon Billings, NP       Future Appointments             In 3 months Jon Billings, NP [place]            Passed - CBC within normal limits and completed in the last 12 months    WBC  Date Value Ref Range Status  08/09/2022 6.7 3.4 - 10.8 x10E3/uL Final   RBC  Date Value Ref Range Status  08/09/2022 5.74 4.14 - 5.80 x10E6/uL Final   Hemoglobin  Date Value Ref Range Status  08/09/2022 16.1 13.0 - 17.7 g/dL Final   Hematocrit  Date Value Ref Range Status  08/09/2022 48.3 37.5 - 51.0 % Final   MCHC  Date Value Ref Range Status  08/09/2022 33.3 31.5 - 35.7 g/dL Final   [facility]  Date Value Ref Range Status  08/09/2022 28.0 26.6 - 33.0 pg Final   MCV  Date Value Ref Range Status  08/09/2022 84 79 - 97 fL Final   No results found for: "PLTCOUNTKUC", "LABPLAT", "POCPLA" RDW  Date Value Ref Range Status  08/09/2022 13.2 11.6 - 15.4 % Final            Requested Prescriptions  Pending Prescriptions Disp Refills   metFORMIN (GLUCOPHAGE) 500 MG tablet [Pharmacy Med Name: METFORMIN HCL 500 MG TABLET]  0     Endocrinology:  Diabetes - Biguanides Failed - 11/22/2022 10:38 AM      Failed - Cr in normal range and within 360 days    Creatinine, Ser  Date Value Ref Range Status  10/04/2022 1.56 (H) 0.76 - 1.27 mg/dL Final         Failed - eGFR in normal range and within 360 days    eGFR  Date Value Ref Range Status  10/04/2022 55 (L) >59 mL/min/1.73 Final         Failed - B12 Level in normal range and within 720 days    No results found for: "VITAMINB12"       Passed - HBA1C is between 0 and 7.9 and within 180 days    Hgb A1c MFr Bld  Date Value Ref Range Status  08/09/2022 6.1 (H) 4.8 - 5.6 % Final    Comment:             Prediabetes: 5.7 - 6.4          Diabetes: >6.4          Glycemic control for adults with diabetes: <7.0          Passed - Valid encounter within last 6 months    Recent Outpatient Visits            Yesterday Controlled type 2 diabetes mellitus with complication, without long-term current use of insulin ([place])   [place] Jon Billings, NP   2 months ago Primary hypertension   [place], Karen, NP   3 months ago Annual physical exam   [place] Jon Billings, NP   6 months ago Controlled type 2 diabetes mellitus with complication, without long-term current use of insulin ([place])   [place] Jon Billings, NP   8 months ago Controlled type 2 diabetes mellitus with complication, without long-term current use of insulin ([place])   [place] Jon Billings, NP       Future Appointments             In 3 months Jon Billings, NP [place]            Passed - CBC within normal limits and completed in the last 12 months    WBC  Date Value Ref Range Status  08/09/2022 6.7 3.4 - 10.8 x10E3/uL Final   RBC  Date Value Ref Range Status  08/09/2022 5.74 4.14 - 5.80 x10E6/uL Final   Hemoglobin  Date Value Ref Range Status  08/09/2022 16.1 13.0 - 17.7 g/dL Final   Hematocrit  Date Value Ref Range Status  08/09/2022 48.3 37.5 - 51.0 % Final   MCHC  Date Value Ref Range Status  08/09/2022 33.3 31.5 - 35.7 g/dL Final   [facility]  Date Value Ref Range Status  08/09/2022 28.0 26.6 - 33.0 pg Final   MCV  Date Value Ref Range Status  08/09/2022 84 79 - 97 fL Final   No results found for: "PLTCOUNTKUC", "LABPLAT", "POCPLA" RDW  Date Value Ref Range Status  08/09/2022 13.2 11.6 - 15.4 % Final

## 2022-11-24 ENCOUNTER — Telehealth: Payer: Self-pay

## 2022-11-24 NOTE — Progress Notes (Signed)
   Care Guide Note  11/24/2022 Name: Ollin Hochmuth MRN: [id number] DOB: Dec 31, 1975  Referred by: Jon Billings, NP Reason for referral : Care Coordination (Outreach to schedule with Pharm d )   Ian Castagna is a 47 y.o. year old male who is a primary care patient of Jon Billings, NP. Diyari Cherne was referred to the pharmacist for assistance related to DM.    Successful contact was made with the patient to discuss pharmacy services including being ready for the pharmacist to call at least 5 minutes before the scheduled appointment time, to have medication bottles and any blood sugar or blood pressure readings ready for review. The patient agreed to meet with the pharmacist via with the pharmacist via telephone visit on (date/time).  11/29/2022  Noreene Larsson, [place], [place] [address] Direct Dial: [phone] [email or web address]

## 2022-11-28 LAB — COMPREHENSIVE METABOLIC PANEL
ALT: 29 IU/L (ref 0–44)
AST: 16 IU/L (ref 0–40)
Albumin/Globulin Ratio: 2.1 (ref 1.2–2.2)
Albumin: 4.7 g/dL (ref 4.1–5.1)
Alkaline Phosphatase: 91 IU/L (ref 44–121)
BUN/Creatinine Ratio: 17 (ref 9–20)
BUN: 27 mg/dL — ABNORMAL HIGH (ref 6–24)
Bilirubin Total: 0.6 mg/dL (ref 0.0–1.2)
CO2: 23 mmol/L (ref 20–29)
Calcium: 9.3 mg/dL (ref 8.7–10.2)
Chloride: 101 mmol/L (ref 96–106)
Creatinine, Ser: 1.61 mg/dL — ABNORMAL HIGH (ref 0.76–1.27)
Globulin, Total: 2.2 g/dL (ref 1.5–4.5)
Glucose: 227 mg/dL — ABNORMAL HIGH (ref 70–99)
Potassium: 3.6 mmol/L (ref 3.5–5.2)
Sodium: 142 mmol/L (ref 134–144)
Total Protein: 6.9 g/dL (ref 6.0–8.5)
eGFR: 53 mL/min/{1.73_m2} — ABNORMAL LOW (ref >59)

## 2022-11-28 LAB — HEMOGLOBIN A1C
Est. average glucose Bld gHb Est-mCnc: 137 mg/dL
Hgb A1c MFr Bld: 6.4 % — ABNORMAL HIGH (ref 4.8–5.6)

## 2022-11-28 NOTE — Progress Notes (Signed)
Please let patient know that his lab work shows that his kidney function is consistent with prior.  His potassium is within normal range.  He needs to continue with the spirolactone.  A1c did increase slightly to 6.4.  I recommend a low carb diet and exercise.

## 2022-11-29 ENCOUNTER — Other Ambulatory Visit: Payer: 59

## 2022-11-29 ENCOUNTER — Telehealth: Payer: Self-pay

## 2022-11-29 ENCOUNTER — Telehealth: Payer: Self-pay | Admitting: Nurse Practitioner

## 2022-11-29 DIAGNOSIS — N1831 Chronic kidney disease, stage 3a: Secondary | ICD-10-CM

## 2022-11-29 MED ORDER — EMPAGLIFLOZIN 25 MG PO TABS: 25.0000 mg | ORAL_TABLET | Freq: Every day | ORAL | 1 refills | Status: DC

## 2022-11-29 NOTE — Telephone Encounter (Signed)
Please let patient know I have sent in Jardiance to help with his kidney function.  I need him to come back for a lab visit in 1 month to recheck his potassium.

## 2022-11-29 NOTE — Progress Notes (Signed)
11/29/2022 Name: Jason King MRN: [id number] DOB: 1975/12/30  Chief Complaint  Patient presents with   Medication Management   Jason King is a 47 y.o. year old male who presented for a telephone visit.   They were referred to the pharmacist by their PCP for assistance in managing medication access.   Patient is participating in a Managed Medicaid Plan:  No  Subjective: Referral from PCP based on nephrology recommendation to initiation [place] therapy.  Patient has been unable to obtain medication due to cost.  Care Team: Primary Care Provider: Jon Billings, NP ; Next Scheduled Visit: 02/21/23  Medication Access/Adherence  [facility] [place] [place] [address] [place] [address] Phone: [phone] Fax: [phone]  [id number] - [place], [address] [place] [address] Phone: [phone] Fax: [phone]  Patient reports affordability concerns with their medications: Yes  Patient reports access/transportation concerns to their pharmacy: No  Patient reports adherence concerns with their medications:  Yes  unable to start Farxiga due to cost  Diabetes:  Current medications: Metforming 586m BID  Current glucose readings: 227 at 11/22/22 office visit, but patient did endorse he was not fasting Patient has testing supplies but endorses only checking blood glucose weekly  Medication Management: Patient reports Good adherence to medications  Patient reports the following barriers to adherence: Cost of Farxiga  Objective:  Lab Results  Component Value Date   HGBA1C 6.4 (H) 11/22/2022   Lab Results  Component Value Date   CREATININE 1.61 (H) 11/22/2022   BUN 27 (H) 11/22/2022   NA 142 11/22/2022   K 3.6 11/22/2022   CL 101 11/22/2022   CO2 23 11/22/2022   Lab Results  Component Value Date   CHOL 153  08/09/2022   HDL 41 08/09/2022   LDLCALC 67 08/09/2022   TRIG 280 (H) 08/09/2022   CHOLHDL 3.7 08/09/2022   Medications Reviewed Today     Reviewed by GDarlina Guys RPH (Pharmacist) on 11/29/22 at [phone] Med List Status: <None>   Medication Order Taking? Sig Documenting Provider Last Dose Status Informant  amLODipine (NORVASC) 10 MG tablet [id number] Take 1 tablet (10 mg total) by mouth daily. HJon Billings NP Taking Active   hydrALAZINE (APRESOLINE) 25 MG tablet [id number] Take 2 tablets (50 mg total) by mouth 2 (two) times daily. HJon Billings NP Taking Active   metFORMIN (GLUCOPHAGE) 500 MG tablet [id number] Take 1 tablet (500 mg total) by mouth 2 (two) times daily with a meal. HJon Billings NP Taking Active   omeprazole (PRILOSEC) 20 MG capsule [id number] Take 1 capsule (20 mg total) by mouth daily. HJon Billings NP Taking Active   pravastatin (PRAVACHOL) 20 MG tablet [id number] Take 1 tablet (20 mg total) by mouth daily. HJon Billings NP Taking Active   spironolactone (ALDACTONE) 50 MG tablet [id number] Take 1 tablet (50 mg total) by mouth daily. HJon Billings NP Taking Active   valsartan-hydrochlorothiazide (DIOVAN-HCT) 160-12.5 MG tablet [id number] Take 1 tablet by mouth daily. HJon Billings NP Taking Active            Assessment/Plan:   Diabetes: - Recommend to check blood glucose at least once daily, preferably fasting in the morning  - CFort Stewartto inquire about drug benefit.  FWilder Gladeis not on formulary, but JGhana  is and would be $40/month of $100/3 months.  I was also able to obtain coupon card to take copay down to $10/3 months.  Requesting PCP place order for Jardiance 82m at [facility] [address] [place]  Follow Up Plan: Once order sent to pharmacy, I will provide them with discount card to verify cost for patient.  CDarlina Guys PharmD,  DPLA

## 2022-11-29 NOTE — Progress Notes (Signed)
   11/29/2022  Patient ID: Jason King, male   DOB: May 22, 1976, 47 y.o.   MRN: [id number]  Contact made to inform patient Jardiance 25mg  [facility], and that with insurance and copay card it is $10 for 30 days.  Expressed understanding and intent to pick up.  Darlina Guys, PharmD, DPLA

## 2022-11-30 NOTE — Telephone Encounter (Signed)
Patient is aware of medication, see other phone encounter.

## 2022-12-06 ENCOUNTER — Ambulatory Visit
Admission: RE | Admit: 2022-12-06 | Discharge: 2022-12-06 | Disposition: A | Discharge status: Home / Self Care | Payer: 59 | Source: Ambulatory Visit | Attending: Nurse Practitioner | Admitting: Nurse Practitioner

## 2022-12-06 DIAGNOSIS — N281 Cyst of kidney, acquired: Secondary | ICD-10-CM | POA: Diagnosis not present

## 2022-12-06 DIAGNOSIS — N2889 Other specified disorders of kidney and ureter: Secondary | ICD-10-CM | POA: Diagnosis not present

## 2022-12-06 DIAGNOSIS — K862 Cyst of pancreas: Secondary | ICD-10-CM | POA: Diagnosis not present

## 2022-12-06 DIAGNOSIS — D1803 Hemangioma of intra-abdominal structures: Secondary | ICD-10-CM | POA: Diagnosis not present

## 2022-12-06 DIAGNOSIS — N261 Atrophy of kidney (terminal): Secondary | ICD-10-CM | POA: Diagnosis not present

## 2022-12-06 MED ORDER — GADOBUTROL 1 MMOL/ML IV SOLN
8.0000 mL | Freq: Once | INTRAVENOUS | Status: AC | PRN
  Administered 2022-12-06: 7.5 mL via INTRAVENOUS

## 2022-12-08 ENCOUNTER — Other Ambulatory Visit: Payer: Self-pay | Admitting: Nurse Practitioner

## 2022-12-08 DIAGNOSIS — R160 Hepatomegaly, not elsewhere classified: Secondary | ICD-10-CM

## 2022-12-08 NOTE — Progress Notes (Signed)
Please let patient know that the area on his liver is stable and appears benign and not cancerous.    There is also an area on his pancreas but it is stable from prior exams and also appears benign and not cancerous.    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 They are already aware of this and will keep an eye on it.   At this time there is no reason to be concerned.  We will repeat the imaging in 6 months to ensure the areas are still the same.

## 2023-01-29 ENCOUNTER — Other Ambulatory Visit: Payer: Self-pay | Admitting: Nurse Practitioner

## 2023-01-30 NOTE — Telephone Encounter (Signed)
Doses were increased.   Requested Prescriptions  Refused Prescriptions Disp Refills   metFORMIN (GLUCOPHAGE-XR) 500 MG 24 hr tablet [Pharmacy Med Name: METFORMIN HCL ER 500 MG TABLET] 30 tablet 5    Sig: TAKE 1 TABLET BY MOUTH EVERY DAY WITH BREAKFAST     Endocrinology:  Diabetes - Biguanides Failed - 01/29/2023  9:15 AM      Failed - Cr in normal range and within 360 days    Creatinine, Ser  Date Value Ref Range Status  11/22/2022 1.61 (H) 0.76 - 1.27 mg/dL Final         Failed - eGFR in normal range and within 360 days    eGFR  Date Value Ref Range Status  11/22/2022 53 (L) >59 mL/min/1.73 Final         Failed - B12 Level in normal range and within 720 days    No results found for: "VITAMINB12"       Passed - HBA1C is between 0 and 7.9 and within 180 days    Hgb A1c MFr Bld  Date Value Ref Range Status  11/22/2022 6.4 (H) 4.8 - 5.6 % Final    Comment:             Prediabetes: 5.7 - 6.4          Diabetes: >6.4          Glycemic control for adults with diabetes: <7.0          Passed - Valid encounter within last 6 months    Recent Outpatient Visits           2 months ago Controlled type 2 diabetes mellitus with complication, without long-term current use of insulin (HCC)   [place] [facility] [place], Clydie Braun, NP   4 months ago Primary hypertension   [place] [facility] [name], NP   5 months ago Annual physical exam   [place] [facility] [name], NP   9 months ago Controlled type 2 diabetes mellitus with complication, without long-term current use of insulin (HCC)   [place] [facility] [name], NP   11 months ago Controlled type 2 diabetes mellitus with complication, without long-term current use of insulin (HCC)   [facility] [name], NP       Future Appointments             In 3 weeks [name], NP  [facility]            Passed - CBC within normal limits and completed in the last 12 months    WBC  Date Value Ref Range Status  08/09/2022 6.7 3.4 - 10.8 x10E3/uL Final   RBC  Date Value Ref Range Status  08/09/2022 5.74 4.14 - 5.80 x10E6/uL Final   Hemoglobin  Date Value Ref Range Status  08/09/2022 16.1 13.0 - 17.7 g/dL Final   Hematocrit  Date Value Ref Range Status  08/09/2022 48.3 37.5 - 51.0 % Final   MCHC  Date Value Ref Range Status  08/09/2022 33.3 31.5 - 35.7 g/dL Final   [facility]  Date Value Ref Range Status  08/09/2022 28.0 26.6 - 33.0 pg Final   MCV  Date Value Ref Range Status  08/09/2022 84 79 - 97 fL Final   No results found for: "PLTCOUNTKUC", "LABPLAT", "POCPLA" RDW  Date Value Ref Range Status  08/09/2022 13.2 11.6 - 15.4 % Final  spironolactone (ALDACTONE) 25 MG tablet [Pharmacy Med Name: SPIRONOLACTONE 25 MG TABLET] 30 tablet 5    Sig: TAKE 1 TABLET (25 MG TOTAL) BY MOUTH DAILY.     Cardiovascular: Diuretics - Aldosterone Antagonist Failed - 01/29/2023  9:15 AM      Failed - Cr in normal range and within 180 days    Creatinine, Ser  Date Value Ref Range Status  11/22/2022 1.61 (H) 0.76 - 1.27 mg/dL Final         Passed - K in normal range and within 180 days    Potassium  Date Value Ref Range Status  11/22/2022 3.6 3.5 - 5.2 mmol/L Final         Passed - Na in normal range and within 180 days    Sodium  Date Value Ref Range Status  11/22/2022 142 134 - 144 mmol/L Final         Passed - eGFR is 30 or above and within 180 days    eGFR  Date Value Ref Range Status  11/22/2022 53 (L) >59 mL/min/1.73 Final         Passed - Last BP in normal range    BP Readings from Last 1 Encounters:  11/22/22 132/80         Passed - Valid encounter within last 6 months    Recent Outpatient Visits           2 months ago Controlled type 2 diabetes mellitus with complication, without long-term current use of insulin (HCC)    [place] [facility] [name], NP   4 months ago Primary hypertension   [place] [facility] [name], NP   5 months ago Annual physical exam   [place] [facility] [name], NP   9 months ago Controlled type 2 diabetes mellitus with complication, without long-term current use of insulin [facility])   [place] [facility] [name], NP   11 months ago Controlled type 2 diabetes mellitus with complication, without long-term current use of insulin (HCC)   [place] [facility] [name], NP       Future Appointments             In 3 weeks [name], NP [facility], [facility]

## 2023-02-21 ENCOUNTER — Encounter: Payer: Self-pay | Admitting: Nurse Practitioner

## 2023-02-21 ENCOUNTER — Ambulatory Visit: Payer: 59 | Admitting: Nurse Practitioner

## 2023-02-21 VITALS — BP 136/84 | HR 69 | Temp 97.9°F | Wt 183.5 lb

## 2023-02-21 DIAGNOSIS — Q615 Medullary cystic kidney: Secondary | ICD-10-CM

## 2023-02-21 DIAGNOSIS — E785 Hyperlipidemia, unspecified: Secondary | ICD-10-CM | POA: Diagnosis not present

## 2023-02-21 DIAGNOSIS — B351 Tinea unguium: Secondary | ICD-10-CM

## 2023-02-21 DIAGNOSIS — I10 Essential (primary) hypertension: Secondary | ICD-10-CM | POA: Diagnosis not present

## 2023-02-21 DIAGNOSIS — E118 Type 2 diabetes mellitus with unspecified complications: Secondary | ICD-10-CM

## 2023-02-21 DIAGNOSIS — N1831 Chronic kidney disease, stage 3a: Secondary | ICD-10-CM | POA: Diagnosis not present

## 2023-02-21 DIAGNOSIS — E876 Hypokalemia: Secondary | ICD-10-CM

## 2023-02-21 NOTE — Assessment & Plan Note (Signed)
Chronic.  Controlled.  Continue with current medication regimen of Pravastatin.  Labs ordered today.  Return to clinic in 3 months for reevaluation.  Call sooner if concerns arise.   

## 2023-02-21 NOTE — Assessment & Plan Note (Signed)
Chronic.  Controlled.  Continue with current medication regimen of Amlodipine, Hydralazine, Aldactone (hypokalemia in the past), Valsartan/HCTZ.  Labs ordered today.  Return to clinic in 3 months for reevaluation.  Call sooner if concerns arise.

## 2023-02-21 NOTE — Progress Notes (Signed)
BP 136/84   Pulse 69   Temp 97.9 F (36.6 C) (Oral)   Wt 183 lb 8 oz (83.2 kg)   SpO2 96%   BMI 27.90 kg/m    Subjective:    Patient ID: Jason King, male    DOB: 10/09/1976, 47 y.o.   MRN: [id number]  HPI: Jason King is a 47 y.o. male  Chief Complaint  Patient presents with   Hypertension   Diabetes   Chronic Kidney Disease   HYPERTENSION Hypertension status: controlled  Satisfied with current treatment? no Duration of hypertension: years BP monitoring frequency:  couple of times a week BP range: 130/80 BP medication side effects:  no Medication compliance: excellent compliance Previous BP meds: hydralazine, amlodipine, and valsartan-HCTZ, spirolactone Aspirin: no Recurrent headaches: no Visual changes: no Palpitations: no Dyspnea: no Chest pain: no Lower extremity edema: no Dizzy/lightheaded: no  CHRONIC KIDNEY DISEASE Followed by Nephrology.  Doing well.   CKD status: controlled Medications renally dose: yes Previous renal evaluation:  Pneumovax:  Not up to Date Influenza Vaccine:  Not up to Date     DIABETES Hypoglycemic episodes:no Polydipsia/polyuria: no Visual disturbance: no Chest pain: no Paresthesias: no Glucose Monitoring: no             Accucheck frequency: 150-250             Fasting glucose:             Post prandial:             Evening:             Before meals: Taking Insulin?: no             Long acting insulin:             Short acting insulin: Blood Pressure Monitoring: not checking Retinal Examination: Not up to Date Foot Exam: Up to Date Diabetic Education: Not Completed Pneumovax: Up to Date Influenza: Up to Date Aspirin: no     Relevant past medical, surgical, family and social history reviewed and updated as indicated. Interim medical history since our last visit reviewed. Allergies and medications reviewed and updated.  Review of Systems  Eyes:  Negative for visual disturbance.  Respiratory:  Negative for  shortness of breath.   Cardiovascular:  Negative for chest pain and leg swelling.  Neurological:  Negative for light-headedness and headaches.    Per HPI unless specifically indicated above     Objective:    BP 136/84   Pulse 69   Temp 97.9 F (36.6 C) (Oral)   Wt 183 lb 8 oz (83.2 kg)   SpO2 96%   BMI 27.90 kg/m   Wt Readings from Last 3 Encounters:  02/21/23 183 lb 8 oz (83.2 kg)  11/22/22 195 lb 1.6 oz (88.5 kg)  09/20/22 186 lb 12.8 oz (84.7 kg)    Physical Exam Vitals and nursing note reviewed.  Constitutional:      General: He is not in acute distress.    Appearance: Normal appearance. He is not ill-appearing, toxic-appearing or diaphoretic.  HENT:     Head: Normocephalic.     Right Ear: External ear normal.     Left Ear: External ear normal.     Nose: Nose normal. No congestion or rhinorrhea.     Mouth/Throat:     Mouth: Mucous membranes are moist.  Eyes:     General:        Right eye: No discharge.  Left eye: No discharge.     Extraocular Movements: Extraocular movements intact.     Conjunctiva/sclera: Conjunctivae normal.     Pupils: Pupils are equal, round, and reactive to light.  Cardiovascular:     Rate and Rhythm: Normal rate and regular rhythm.     Heart sounds: No murmur heard. Pulmonary:     Effort: Pulmonary effort is normal. No respiratory distress.     Breath sounds: Normal breath sounds. No wheezing, rhonchi or rales.  Abdominal:     General: Abdomen is flat. Bowel sounds are normal.  Musculoskeletal:     Cervical back: Normal range of motion and neck supple.  Skin:    General: Skin is warm and dry.     Capillary Refill: Capillary refill takes less than 2 seconds.  Neurological:     General: No focal deficit present.     Mental Status: He is alert and oriented to person, place, and time.  Psychiatric:        Mood and Affect: Mood normal.        Behavior: Behavior normal.        Thought Content: Thought content normal.         Judgment: Judgment normal.     Results for orders placed or performed in visit on 11/22/22  Comprehensive metabolic panel  Result Value Ref Range   Glucose 227 (H) 70 - 99 mg/dL   BUN 27 (H) 6 - 24 mg/dL   Creatinine, Ser 2.95 (H) 0.76 - 1.27 mg/dL   eGFR 53 (L) >62 ZH/YQM/5.78   BUN/Creatinine Ratio 17 9 - 20   Sodium 142 134 - 144 mmol/L   Potassium 3.6 3.5 - 5.2 mmol/L   Chloride 101 96 - 106 mmol/L   CO2 23 20 - 29 mmol/L   Calcium 9.3 8.7 - 10.2 mg/dL   Total Protein 6.9 6.0 - 8.5 g/dL   Albumin 4.7 4.1 - 5.1 g/dL   Globulin, Total 2.2 1.5 - 4.5 g/dL   Albumin/Globulin Ratio 2.1 1.2 - 2.2   Bilirubin Total 0.6 0.0 - 1.2 mg/dL   Alkaline Phosphatase 91 44 - 121 IU/L   AST 16 0 - 40 IU/L   ALT 29 0 - 44 IU/L  Hemoglobin A1c  Result Value Ref Range   Hgb A1c MFr Bld 6.4 (H) 4.8 - 5.6 %   Est. average glucose Bld gHb Est-mCnc 137 mg/dL      Assessment & Plan:   Problem List Items Addressed This Visit       Cardiovascular and Mediastinum   Primary hypertension    Chronic.  Controlled.  Continue with current medication regimen of Amlodipine, Hydralazine, Aldactone (hypokalemia in the past), Valsartan/HCTZ.  Labs ordered today.  Return to clinic in 3 months for reevaluation.  Call sooner if concerns arise.        Relevant Orders   Comp Met (CMET)     Endocrine   Controlled type 2 diabetes mellitus with complication, without long-term current use of insulin (HCC) - Primary    Chronic. Controlled.   Now on Jardiance for diabetes and CKD.  Continue with Metformin.  Labs ordered today.  Will make recommendations based on lab results.   Follow up in 3 months for reevaluation.  Call sooner if concerns arise.       Relevant Orders   HgB A1c     Genitourinary   Chronic kidney disease, stage 3a (HCC)    Chronic. No on Jardiance.  Not on  PAP- [place] is on formulary.  Continue with current medication regimen.         Relevant Orders   CBC w/Diff   Medullary cystic  disease of the kidney    Chronic.  On Jardiance.  Followed by Nephrology.  Up to date on microalbumin.  Follow up in 3 months.  Call sooner if concerns arise.         Other   Hyperlipidemia    Chronic.  Controlled.  Continue with current medication regimen of Pravastatin.  Labs ordered today.  Return to clinic in 3 months for reevaluation.  Call sooner if concerns arise.        Relevant Orders   Lipid Profile   Hypokalemia    Labs ordered at visit today.  Will make recommendations based on lab results.        Other Visit Diagnoses     Toenail fungus       Will refer to podiatry.   Relevant Orders   Ambulatory referral to Podiatry        Follow up plan: Return in about 3 months (around 05/24/2023) for HTN, HLD, DM2 FU.

## 2023-02-21 NOTE — Assessment & Plan Note (Signed)
Chronic.  On Jardiance.  Followed by Nephrology.  Up to date on microalbumin.  Follow up in 3 months.  Call sooner if concerns arise.

## 2023-02-21 NOTE — Assessment & Plan Note (Signed)
Chronic. No on Jardiance.  Not on PAP- Jardiance is on formulary.  Continue with current medication regimen.

## 2023-02-21 NOTE — Assessment & Plan Note (Signed)
Chronic. Controlled.   Now on Jardiance for diabetes and CKD.  Continue with Metformin.  Labs ordered today.  Will make recommendations based on lab results.   Follow up in 3 months for reevaluation.  Call sooner if concerns arise.

## 2023-02-21 NOTE — Assessment & Plan Note (Signed)
Labs ordered at visit today.  Will make recommendations based on lab results.   

## 2023-02-22 LAB — CBC WITH DIFFERENTIAL/PLATELET
Basophils Absolute: 0.1 10*3/uL (ref 0.0–0.2)
Basos: 1 %
EOS (ABSOLUTE): 0.3 10*3/uL (ref 0.0–0.4)
Eos: 4 %
Hematocrit: 50.6 % (ref 37.5–51.0)
Hemoglobin: 16.1 g/dL (ref 13.0–17.7)
Immature Grans (Abs): 0 10*3/uL (ref 0.0–0.1)
Immature Granulocytes: 1 %
Lymphocytes Absolute: 1.8 10*3/uL (ref 0.7–3.1)
Lymphs: 23 %
MCH: 26.8 pg (ref 26.6–33.0)
MCHC: 31.8 g/dL (ref 31.5–35.7)
MCV: 84 fL (ref 79–97)
Monocytes Absolute: 0.4 10*3/uL (ref 0.1–0.9)
Monocytes: 5 %
Neutrophils Absolute: 5.3 10*3/uL (ref 1.4–7.0)
Neutrophils: 66 %
Platelets: 206 10*3/uL (ref 150–450)
RBC: 6.01 x10E6/uL — ABNORMAL HIGH (ref 4.14–5.80)
RDW: 13.8 % (ref 11.6–15.4)
WBC: 8 10*3/uL (ref 3.4–10.8)

## 2023-02-22 LAB — COMPREHENSIVE METABOLIC PANEL
ALT: 25 IU/L (ref 0–44)
AST: 18 IU/L (ref 0–40)
Albumin/Globulin Ratio: 2 (ref 1.2–2.2)
Albumin: 4.5 g/dL (ref 4.1–5.1)
Alkaline Phosphatase: 102 IU/L (ref 44–121)
BUN/Creatinine Ratio: 20 (ref 9–20)
BUN: 35 mg/dL — ABNORMAL HIGH (ref 6–24)
Bilirubin Total: 0.4 mg/dL (ref 0.0–1.2)
CO2: 21 mmol/L (ref 20–29)
Calcium: 9.7 mg/dL (ref 8.7–10.2)
Chloride: 102 mmol/L (ref 96–106)
Creatinine, Ser: 1.71 mg/dL — ABNORMAL HIGH (ref 0.76–1.27)
Globulin, Total: 2.2 g/dL (ref 1.5–4.5)
Glucose: 145 mg/dL — ABNORMAL HIGH (ref 70–99)
Potassium: 3.5 mmol/L (ref 3.5–5.2)
Sodium: 141 mmol/L (ref 134–144)
Total Protein: 6.7 g/dL (ref 6.0–8.5)
eGFR: 50 mL/min/{1.73_m2} — ABNORMAL LOW (ref >59)

## 2023-02-22 LAB — LIPID PANEL
Chol/HDL Ratio: 3.4 ratio (ref 0.0–5.0)
Cholesterol, Total: 134 mg/dL (ref 100–199)
HDL: 40 mg/dL (ref >39)
LDL Chol Calc (NIH): 55 mg/dL (ref 0–99)
Triglycerides: 245 mg/dL — ABNORMAL HIGH (ref 0–149)
VLDL Cholesterol Cal: 39 mg/dL (ref 5–40)

## 2023-02-22 LAB — HEMOGLOBIN A1C
Est. average glucose Bld gHb Est-mCnc: 134 mg/dL
Hgb A1c MFr Bld: 6.3 % — ABNORMAL HIGH (ref 4.8–5.6)

## 2023-02-22 NOTE — Progress Notes (Signed)
Please let the patient know that his lab work is stable.  Kidney function remains stable.  A1c is 6.3% which is great.  No other concerns at this time.  Follow up as discussed.

## 2023-03-01 ENCOUNTER — Telehealth: Payer: Self-pay | Admitting: Nurse Practitioner

## 2023-03-01 ENCOUNTER — Telehealth: Payer: Self-pay

## 2023-03-01 NOTE — Telephone Encounter (Signed)
PA for Jardiance initiated and submitted via Cover My Meds. Key: [id number]  Awaiting determination from the patient's insurance.

## 2023-03-02 NOTE — Telephone Encounter (Signed)
PA approved.   Called and notified pharmacy of approval so they could rerun the prescription.

## 2023-03-14 ENCOUNTER — Other Ambulatory Visit: Payer: Self-pay | Admitting: Podiatry

## 2023-03-14 ENCOUNTER — Ambulatory Visit: Payer: 59 | Admitting: Podiatry

## 2023-03-14 DIAGNOSIS — L600 Ingrowing nail: Secondary | ICD-10-CM

## 2023-03-14 MED ORDER — CICLOPIROX 8 % EX SOLN: Freq: Every day | CUTANEOUS | 0 refills | Status: DC

## 2023-03-14 NOTE — Progress Notes (Signed)
Subjective:  Patient ID: Jason King, male    DOB: 1976/09/11,  MRN: [id number]  Chief Complaint  Patient presents with   Nail Problem    Nail fungus bilateral hallux pt stated his nails are turning yellow     47 y.o. male presents with the above complaint.  Patient presents with complaint of right lateral border ingrown painful to touch is progressive gotten worse hurts with ambulation worse with pressure he would like for me to remove it he has not seen and was prior to seeing me denies any other acute complaints.  Pain scale 7 out of 10 dull achy in nature   Review of Systems: Negative except as noted in the HPI. Denies N/V/F/Ch.  Past Medical History:  Diagnosis Date   Hyperlipidemia    Hypertension     Current Outpatient Medications:    ciclopirox (PENLAC) 8 % solution, Apply topically at bedtime. Apply over nail and surrounding skin. Apply daily over previous coat. After seven (7) days, may remove with alcohol and continue cycle., Disp: 6.6 mL, Rfl: 0   amLODipine (NORVASC) 10 MG tablet, Take 1 tablet (10 mg total) by mouth daily., Disp: 90 tablet, Rfl: 1   chlorhexidine (PERIDEX) 0.12 % solution, SMARTSIG:0.5 Ounce(s) By Mouth Morning-Night, Disp: , Rfl:    empagliflozin (JARDIANCE) 25 MG TABS tablet, Take 1 tablet (25 mg total) by mouth daily before breakfast., Disp: 90 tablet, Rfl: 1   hydrALAZINE (APRESOLINE) 25 MG tablet, Take 2 tablets (50 mg total) by mouth 2 (two) times daily., Disp: 360 tablet, Rfl: 1   metFORMIN (GLUCOPHAGE) 500 MG tablet, Take 1 tablet (500 mg total) by mouth 2 (two) times daily with a meal., Disp: 180 tablet, Rfl: 1   omeprazole (PRILOSEC) 20 MG capsule, Take 1 capsule (20 mg total) by mouth daily., Disp: 90 capsule, Rfl: 1   pravastatin (PRAVACHOL) 20 MG tablet, Take 1 tablet (20 mg total) by mouth daily., Disp: 90 tablet, Rfl: 1   spironolactone (ALDACTONE) 50 MG tablet, Take 1 tablet (50 mg total) by mouth daily., Disp: 90 tablet, Rfl: 1    valsartan-hydrochlorothiazide (DIOVAN-HCT) 160-12.5 MG tablet, Take 1 tablet by mouth daily., Disp: 90 tablet, Rfl: 1  Social History   Tobacco Use  Smoking Status Former   Types: Cigarettes  Smokeless Tobacco Former    No Known Allergies Objective:  There were no vitals filed for this visit. There is no height or weight on file to calculate BMI. Constitutional Well developed. Well nourished.  Vascular Dorsalis pedis pulses palpable bilaterally. Posterior tibial pulses palpable bilaterally. Capillary refill normal to all digits.  No cyanosis or clubbing noted. Pedal hair growth normal.  Neurologic Normal speech. Oriented to person, place, and time. Epicritic sensation to light touch grossly present bilaterally.  Dermatologic Painful ingrowing nail at lateral nail borders of the hallux nail right. No other open wounds. No skin lesions.  Orthopedic: Normal joint ROM without pain or crepitus bilaterally. No visible deformities. No bony tenderness.   Radiographs: None Assessment:   1. Ingrown toenail of right foot    Plan:  Patient was evaluated and treated and all questions answered.  Ingrown Nail, right -Patient elects to proceed with minor surgery to remove ingrown toenail removal today. Consent reviewed and signed by patient. -Ingrown nail excised. See procedure note. -Educated on post-procedure care including soaking. Written instructions provided and reviewed. -Patient to follow up in 2 weeks for nail check.  Procedure: Excision of Ingrown Toenail Location: Right 1st toe lateral  nail borders. Anesthesia: Lidocaine 1% plain; 1.5 mL and Marcaine 0.5% plain; 1.5 mL, digital block. Skin Prep: Betadine. Dressing: Silvadene; telfa; dry, sterile, compression dressing. Technique: Following skin prep, the toe was exsanguinated and a tourniquet was secured at the base of the toe. The affected nail border was freed, split with a nail splitter, and excised. Chemical  matrixectomy was then performed with phenol and irrigated out with alcohol. The tourniquet was then removed and sterile dressing applied. Disposition: Patient tolerated procedure well. Patient to return in 2 weeks for follow-up.   No follow-ups on file.

## 2023-04-24 ENCOUNTER — Telehealth: Payer: Self-pay | Admitting: Nurse Practitioner

## 2023-04-24 DIAGNOSIS — H538 Other visual disturbances: Secondary | ICD-10-CM

## 2023-04-24 NOTE — Telephone Encounter (Signed)
Copied from CRM [id number]. Topic: Referral - Request for Referral >> Apr 24, 2023  1:50 PM Patsy Lager T wrote: Has patient seen PCP for this complaint? Yes.   *If NO, is insurance requiring patient see PCP for this issue before PCP can refer them? Referral for which specialty: Ophthalmologist Preferred provider/office: unknown Reason for referral: eye sight is blurred

## 2023-04-25 IMAGING — US US ABDOMEN LIMITED
1 series · 14 of 25 positions shown · non-contrast
Comparison: None.

CLINICAL DATA: Chronic right upper quadrant pain, fatty liver

EXAM:
ULTRASOUND ABDOMEN LIMITED RIGHT UPPER QUADRANT

[Series 1: us abdomen limited · 0.20mm/px · 72 acquisitions, 14 frames shown]
[im 1/72]
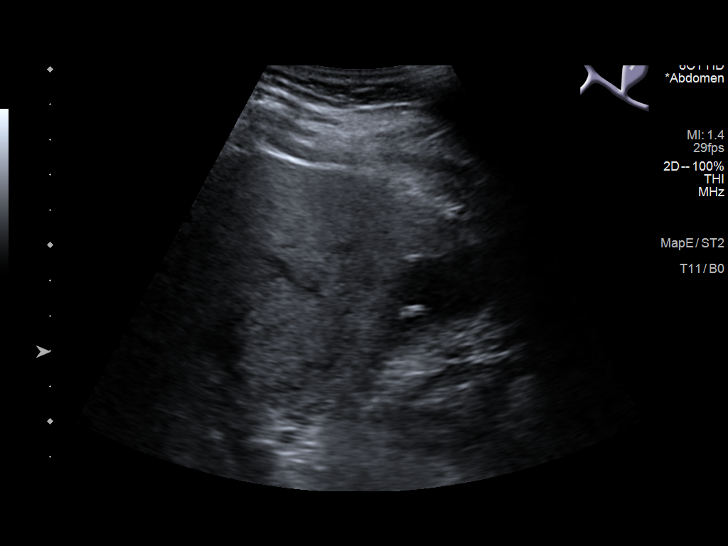
[im 6/72]
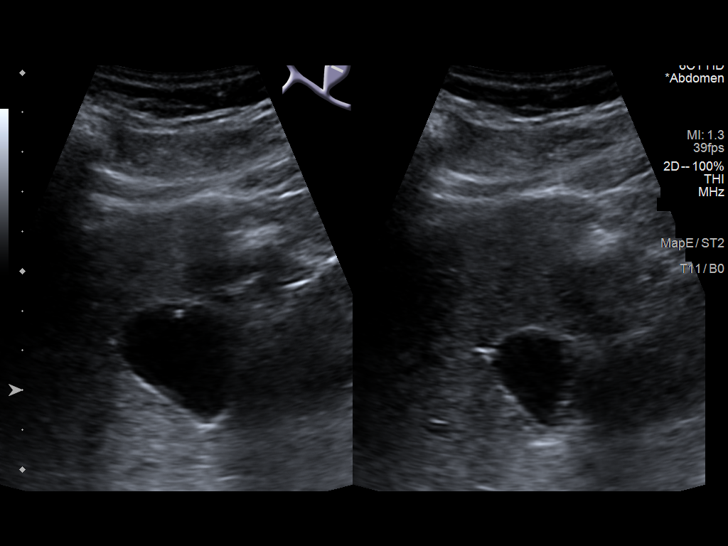
[im 12/72]
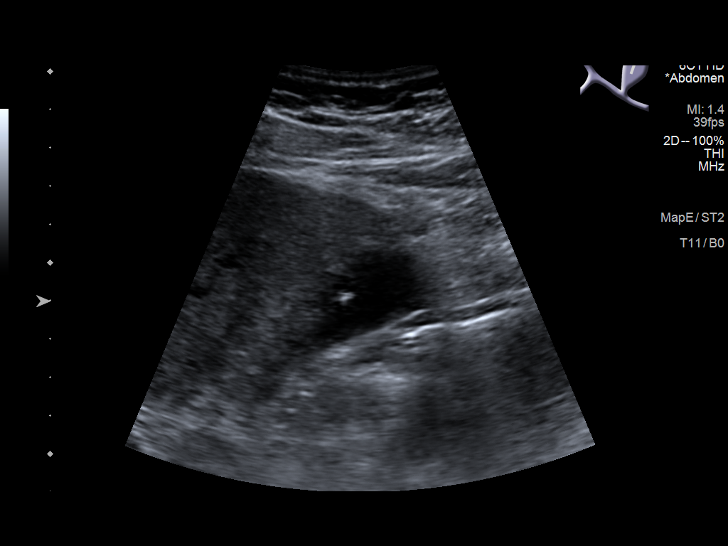
[im 18/72]
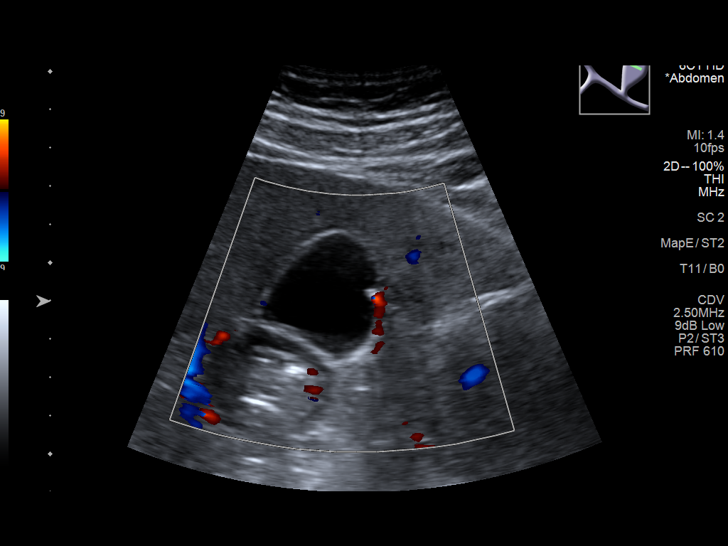
[im 24/72]
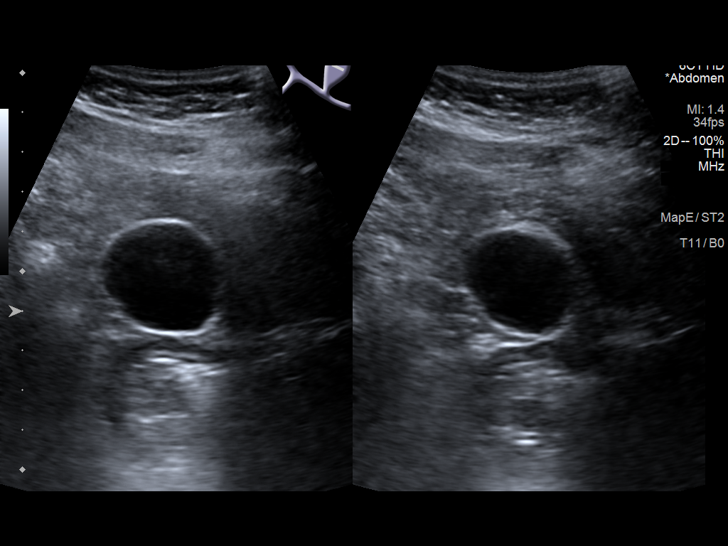
[im 27/72]
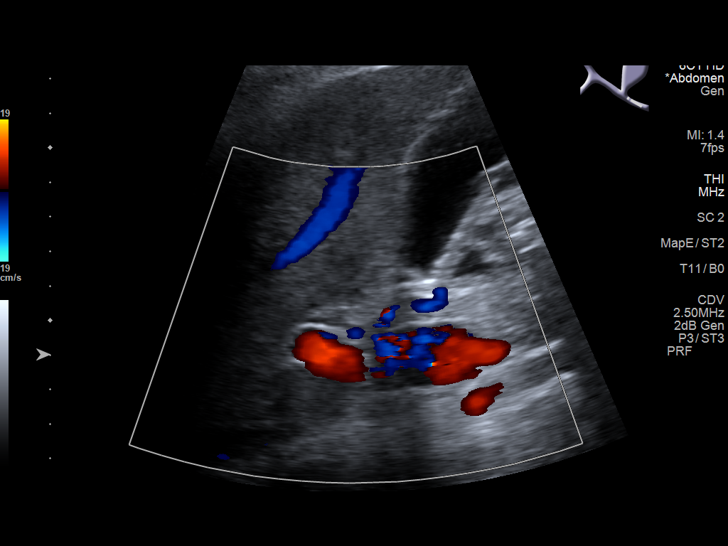
[im 33/72]
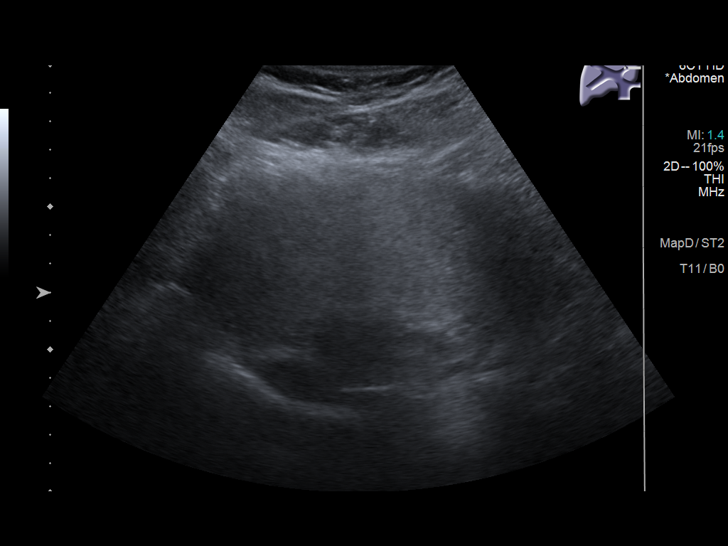
[im 39/72]
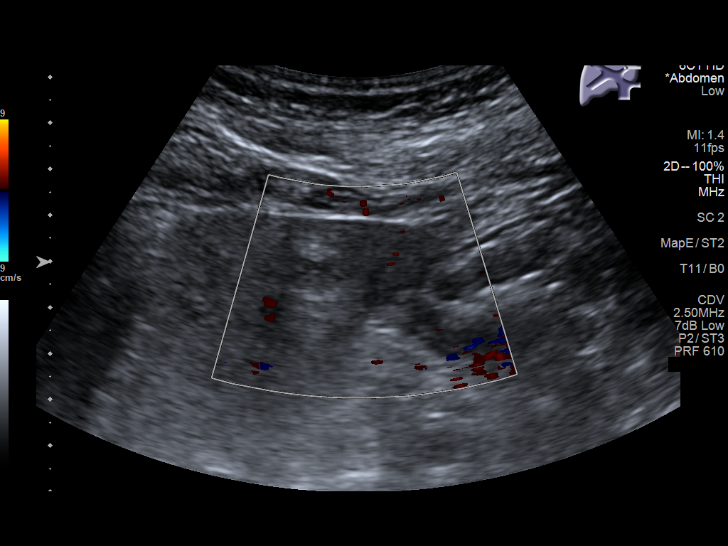
[im 45/72]
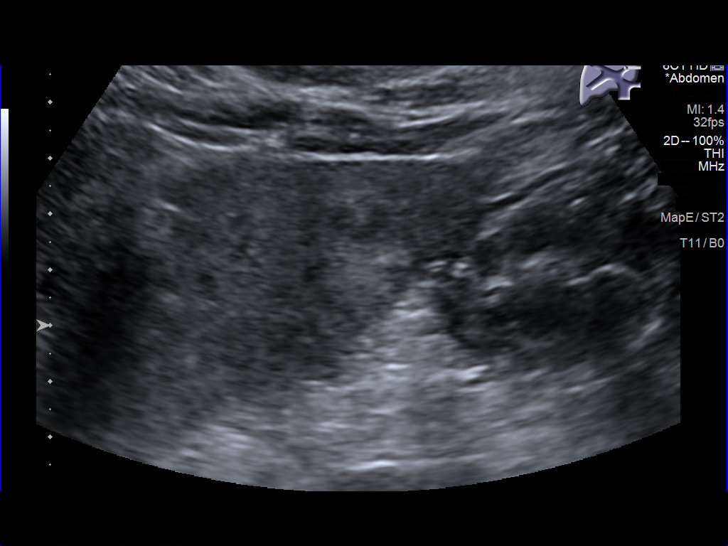
[im 48/72]
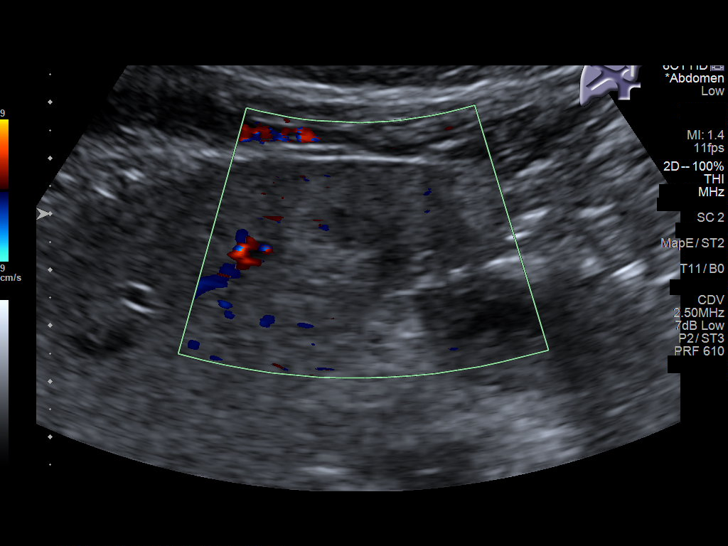
[im 54/72]
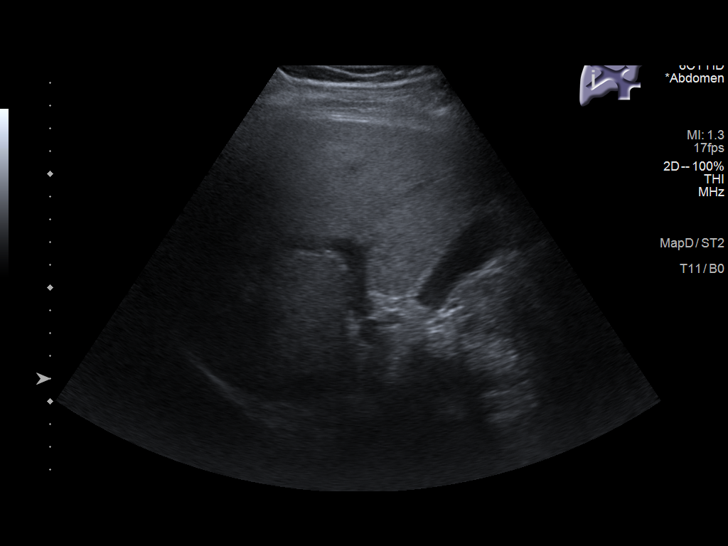
[im 60/72]
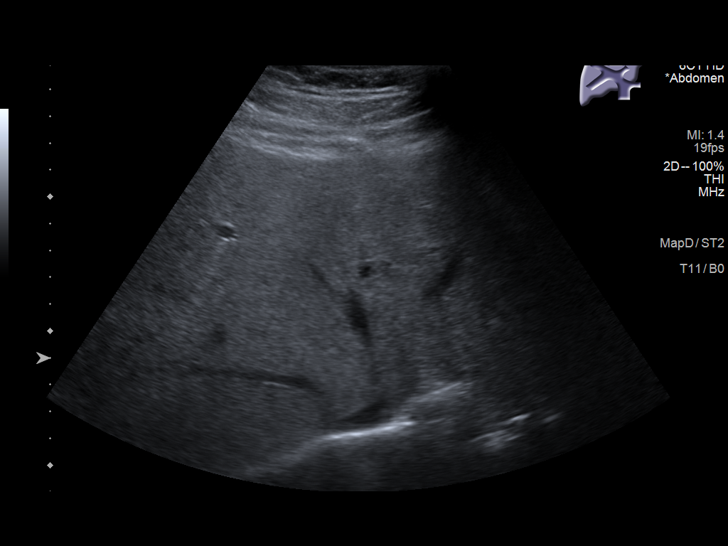
[im 66/72]
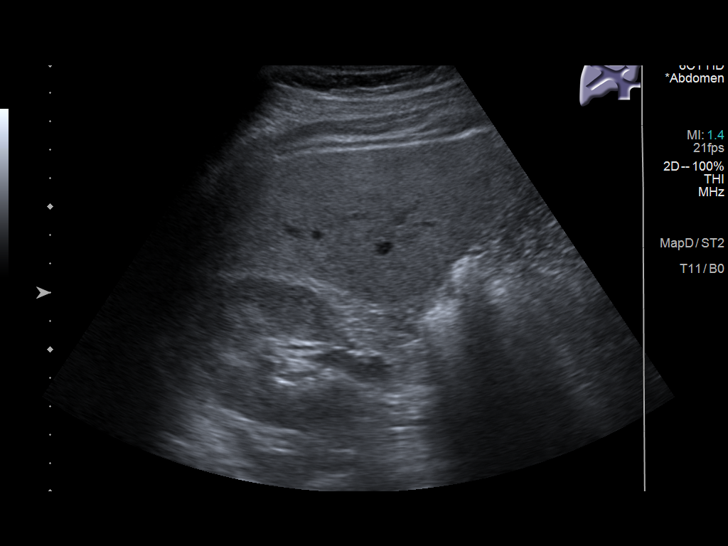
[im 72/72]
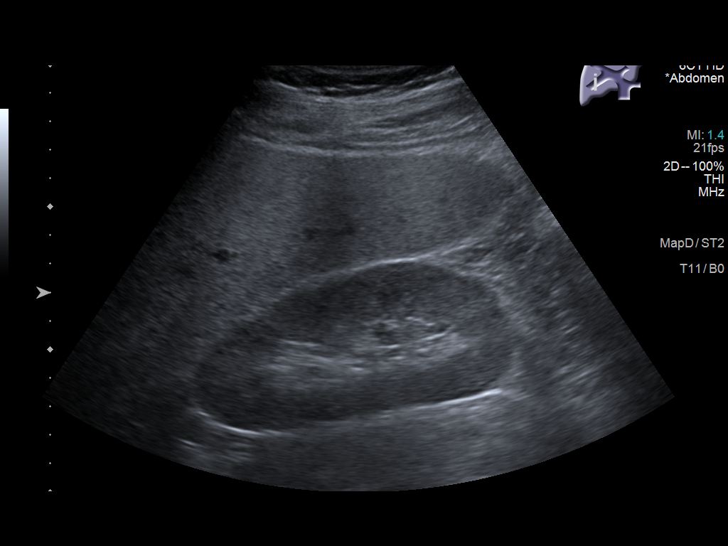

[14 of 25 positions shown; findings below may reference images not displayed]

FINDINGS: Gallbladder:

There are no demonstrable gallbladder stones. There is possible 6 mm
gallbladder polyp. There is no wall thickening. Technologist did not
observe any tenderness over the gallbladder.

Common bile duct:

Diameter: 3.5 mm.

Liver:

There is increased echogenicity in the liver suggesting fatty
infiltration. There is an ill-defined 1.4 cm hypoechoic structure in
the left lobe of liver. Portal vein is patent on color Doppler
imaging with normal direction of blood flow towards the liver.

Other: None.
IMPRESSION: 6 mm gallbladder polyp.  There are no signs of acute cholecystitis.

Fatty liver. There is 1.4 cm hypoechoic structure in the left lobe
of liver. Differential diagnostic possibilities would include
atypical hemangioma or some other space-occupying neoplastic
process. Comparison with previous studies or short-term follow-up
sonogram or multiphasic CT in 3 months may be considered.

## 2023-05-07 ENCOUNTER — Other Ambulatory Visit: Payer: Self-pay | Admitting: Nurse Practitioner

## 2023-05-09 NOTE — Telephone Encounter (Signed)
Requested Prescriptions  Pending Prescriptions Disp Refills   metFORMIN (GLUCOPHAGE) 500 MG tablet [Pharmacy Med Name: METFORMIN HCL 500 MG TABLET] 60 tablet 5    Sig: TAKE 1 TABLET BY MOUTH 2 TIMES DAILY WITH A MEAL.     Endocrinology:  Diabetes - Biguanides Failed - 05/07/2023 10:09 AM      Failed - Cr in normal range and within 360 days    Creatinine, Ser  Date Value Ref Range Status  02/21/2023 1.71 (H) 0.76 - 1.27 mg/dL Final         Failed - eGFR in normal range and within 360 days    eGFR  Date Value Ref Range Status  02/21/2023 50 (L) >59 mL/min/1.73 Final         Failed - B12 Level in normal range and within 720 days    No results found for: "VITAMINB12"       Passed - HBA1C is between 0 and 7.9 and within 180 days    Hgb A1c MFr Bld  Date Value Ref Range Status  02/21/2023 6.3 (H) 4.8 - 5.6 % Final    Comment:             Prediabetes: 5.7 - 6.4          Diabetes: >6.4          Glycemic control for adults with diabetes: <7.0          Passed - Valid encounter within last 6 months    Recent Outpatient Visits           2 months ago Controlled type 2 diabetes mellitus with complication, without long-term current use of insulin (HCC)   [place] [facility] [place], Clydie Braun, NP   5 months ago Controlled type 2 diabetes mellitus with complication, without long-term current use of insulin (HCC)   [place] [place], Clydie Braun, NP   7 months ago Primary hypertension   [place] [facility] [name], NP   9 months ago Annual physical exam   [place] [facility] [name], NP   1 year ago Controlled type 2 diabetes mellitus with complication, without long-term current use of insulin (HCC)   [place] [facility] [name], NP       Future Appointments             In 3 weeks Mecum, Oswaldo Conroy, PA-C [place] [facility]             Passed - CBC within normal limits and completed in the last 12 months    WBC  Date Value Ref Range Status  02/21/2023 8.0 3.4 - 10.8 x10E3/uL Final   RBC  Date Value Ref Range Status  02/21/2023 6.01 (H) 4.14 - 5.80 x10E6/uL Final   Hemoglobin  Date Value Ref Range Status  02/21/2023 16.1 13.0 - 17.7 g/dL Final   Hematocrit  Date Value Ref Range Status  02/21/2023 50.6 37.5 - 51.0 % Final   MCHC  Date Value Ref Range Status  02/21/2023 31.8 31.5 - 35.7 g/dL Final   [facility]  Date Value Ref Range Status  02/21/2023 26.8 26.6 - 33.0 pg Final   MCV  Date Value Ref Range Status  02/21/2023 84 79 - 97 fL Final   No results found for: "PLTCOUNTKUC", "LABPLAT", "POCPLA" RDW  Date Value Ref Range Status  02/21/2023 13.8 11.6 - 15.4 % Final

## 2023-05-27 ENCOUNTER — Other Ambulatory Visit: Payer: Self-pay | Admitting: Nurse Practitioner

## 2023-05-30 ENCOUNTER — Ambulatory Visit: Payer: 59 | Admitting: Physician Assistant

## 2023-05-30 ENCOUNTER — Encounter: Payer: Self-pay | Admitting: Physician Assistant

## 2023-05-30 VITALS — BP 125/81 | HR 82 | Wt 188.2 lb

## 2023-05-30 DIAGNOSIS — I10 Essential (primary) hypertension: Secondary | ICD-10-CM | POA: Diagnosis not present

## 2023-05-30 DIAGNOSIS — Z7984 Long term (current) use of oral hypoglycemic drugs: Secondary | ICD-10-CM | POA: Diagnosis not present

## 2023-05-30 DIAGNOSIS — E118 Type 2 diabetes mellitus with unspecified complications: Secondary | ICD-10-CM | POA: Diagnosis not present

## 2023-05-30 DIAGNOSIS — E785 Hyperlipidemia, unspecified: Secondary | ICD-10-CM | POA: Diagnosis not present

## 2023-05-30 LAB — BAYER DCA HB A1C WAIVED: HB A1C (BAYER DCA - WAIVED): 6.5 % — ABNORMAL HIGH (ref 4.8–5.6)

## 2023-05-30 MED ORDER — SPIRONOLACTONE 50 MG PO TABS: 50.0000 mg | ORAL_TABLET | Freq: Every day | ORAL | 1 refills | Status: AC

## 2023-05-30 MED ORDER — OMEPRAZOLE 20 MG PO CPDR: 20.0000 mg | DELAYED_RELEASE_CAPSULE | Freq: Every day | ORAL | 1 refills | Status: AC

## 2023-05-30 MED ORDER — AMLODIPINE BESYLATE 10 MG PO TABS: 10.0000 mg | ORAL_TABLET | Freq: Every day | ORAL | 1 refills | Status: AC

## 2023-05-30 MED ORDER — VALSARTAN-HYDROCHLOROTHIAZIDE 160-12.5 MG PO TABS: 1.0000 | ORAL_TABLET | Freq: Every day | ORAL | 1 refills | Status: AC

## 2023-05-30 MED ORDER — PRAVASTATIN SODIUM 20 MG PO TABS: 20.0000 mg | ORAL_TABLET | Freq: Every day | ORAL | 1 refills | Status: AC

## 2023-05-30 MED ORDER — HYDRALAZINE HCL 25 MG PO TABS: 50.0000 mg | ORAL_TABLET | Freq: Two times a day (BID) | ORAL | 1 refills | Status: AC

## 2023-05-30 NOTE — Progress Notes (Unsigned)
Established Patient Office Visit  Name: Jason King   MRN: [id number]    DOB: 22-Jun-1976   Date:05/31/2023  Today's Provider: Jacquelin Hawking, MHS, PA-C Introduced myself to the patient as a PA-C and provided education on APPs in clinical practice.         Subjective  Chief Complaint  Chief Complaint  Patient presents with   Hyperlipidemia   Hypertension   Diabetes    Patient says he is scheduled for next month for an eye exam.     HPI  HYPERTENSION / HYPERLIPIDEMIA Satisfied with current treatment? yes Duration of hypertension: years BP monitoring frequency: a few times a month BP range: 120-130s/80s BP medication side effects: no Past BP meds: amlodipine, spironalactone, and valsartan-HCTZ, hydralazine  Duration of hyperlipidemia: years Cholesterol medication side effects: no Cholesterol supplements: none Past cholesterol medications: pravastatin (pravachol) Medication compliance: good compliance Aspirin: no Recent stressors: no Recurrent headaches: no Visual changes: no Palpitations: no Dyspnea: no Chest pain: no Lower extremity edema: no Dizzy/lightheaded: no  Diabetes, Type 2 - Last A1c 6.3 - Medications: Jardiance 25 mg PO every day, Metformin 500 mg PO BID - Compliance: good compliance  - Checking BG at home: sometimes checking - 150s-180s does not check fasting regularly  - Diet: he is not engaged with diabetes conscious diet  - Exercise: not engaged in regular exercise  - Eye exam: he is scheduled for one next month  - Foot exam: UTD - Microalbumin: utd - Statin: on statin  - PNA vaccine: NA - Denies symptoms of hypoglycemia, polyuria, polydipsia, numbness extremities, foot ulcers/trauma   Patient Active Problem List   Diagnosis Date Noted   Hypokalemia 09/20/2022   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 No pertinent family history.  Social History   Tobacco Use   Smoking status: Former    Types: Cigarettes   Smokeless tobacco: Former  Substance Use Topics   Alcohol use: Not Currently     Current Outpatient Medications:    chlorhexidine (PERIDEX) 0.12 % solution, SMARTSIG:0.5 Ounce(s) By Mouth Morning-Night, Disp: , Rfl:    ciclopirox (PENLAC) 8 % solution, Apply topically at bedtime. Apply over nail and surrounding skin. Apply daily over previous coat. After seven (7) days, may remove with alcohol and continue cycle., Disp: 6.6 mL, Rfl: 0   empagliflozin (JARDIANCE) 25 MG TABS tablet, TAKE 1 TABLET BY MOUTH DAILY BEFORE BREAKFAST., Disp: 90 tablet, Rfl: 0   metFORMIN (GLUCOPHAGE) 500 MG tablet, TAKE 1 TABLET BY MOUTH 2 TIMES DAILY WITH A MEAL., Disp: 60 tablet, Rfl: 5   amLODipine (NORVASC) 10 MG tablet, Take 1 tablet (10 mg total) by mouth daily., Disp: 90 tablet, Rfl: 1   hydrALAZINE (APRESOLINE) 25 MG tablet, Take 2 tablets (50 mg total) by mouth 2 (two) times daily., Disp: 360 tablet, Rfl: 1   omeprazole (PRILOSEC) 20 MG capsule, Take 1 capsule (20 mg total) by mouth daily., Disp: 90 capsule, Rfl: 1   pravastatin (PRAVACHOL) 20 MG tablet, Take 1 tablet (20 mg total) by mouth daily., Disp: 90 tablet, Rfl: 1   spironolactone (ALDACTONE)  50 MG tablet, Take 1 tablet (50 mg total) by mouth daily., Disp: 90 tablet, Rfl: 1   valsartan-hydrochlorothiazide (DIOVAN-HCT) 160-12.5 MG tablet, Take 1 tablet by mouth daily., Disp: 90 tablet, Rfl: 1  No Known Allergies  I personally reviewed active problem list, medication list, allergies, health maintenance, notes from last encounter, lab results with the patient/caregiver today.   ROS  See hpi for  relevant ROS   Objective  Vitals:   05/30/23 1016  BP: 125/81  Pulse: 82  SpO2: 96%  Weight: 188 lb 3.2 oz (85.4 kg)    Body mass index is 28.62 kg/m.  Physical Exam Vitals reviewed.  Constitutional:      Appearance: Normal appearance.  HENT:     Head: Normocephalic and atraumatic.  Eyes:     Extraocular Movements: Extraocular movements intact.     Conjunctiva/sclera: Conjunctivae normal.  Cardiovascular:     Rate and Rhythm: Normal rate and regular rhythm.     Pulses: Normal pulses.     Heart sounds: Normal heart sounds. No murmur heard.    No friction rub. No gallop.  Pulmonary:     Effort: Pulmonary effort is normal. No respiratory distress.     Breath sounds: Normal breath sounds. No wheezing, rhonchi or rales.  Musculoskeletal:     Cervical back: Normal range of motion.  Skin:    General: Skin is warm and dry.  Neurological:     General: No focal deficit present.     Mental Status: He is alert and oriented to person, place, and time.  Psychiatric:        Mood and Affect: Mood normal.        Behavior: Behavior normal.        Thought Content: Thought content normal.        Judgment: Judgment normal.      Recent Results (from the past 2160 hour(s))  Bayer DCA Hb A1c Waived (STAT)     Status: Abnormal   Collection Time: 05/30/23 10:56 AM  Result Value Ref Range   HB A1C (BAYER DCA - WAIVED) 6.5 (H) 4.8 - 5.6 %    Comment:          Prediabetes: 5.7 - 6.4          Diabetes: >6.4          Glycemic control for adults with diabetes: <7.0      PHQ2/9:    05/30/2023   10:20 AM 02/21/2023    9:40 AM 11/22/2022   10:02 AM 09/20/2022    9:46 AM 08/09/2022    9:46 AM  Depression screen PHQ 2/9  Decreased Interest 0 0 0 0 0  Down, Depressed, Hopeless 0 0 0 0 0  PHQ - 2 Score 0 0 0 0 0  Altered sleeping 0 0 0 0 0  Tired, decreased energy 0 0 0 0 0  Change in appetite 0 0 0 0 0  Feeling bad or failure about yourself  0 0 0 0 0  Trouble concentrating 0 0 0 0 0   Moving slowly or fidgety/restless 0 0 0 0 0  Suicidal thoughts 0 0 0 0 0  PHQ-9 Score 0 0 0 0 0  Difficult doing work/chores Not difficult at all  Not difficult at all Not difficult at all Not difficult at all      Fall Risk:    05/30/2023   10:20 AM 02/21/2023    9:40 AM 11/22/2022   10:02 AM 09/20/2022  9:45 AM 08/09/2022    9:46 AM  Fall Risk   Falls in the past year? 0 0 0 0 0  Number falls in past yr: 0 0 0 0 0  Injury with Fall? 0 0 0 0 0  Risk for fall due to : No Fall Risks No Fall Risks No Fall Risks No Fall Risks No Fall Risks  Follow up Falls evaluation completed Falls evaluation completed Falls evaluation completed Falls evaluation completed Falls evaluation completed      Functional Status Survey:      Assessment & Plan  Problem List Items Addressed This Visit       Cardiovascular and Mediastinum   Primary hypertension    Chronic, historic condition  Appears well controlled on current regimen comprised of Amlodipine 10 mg PO every day, Spironolactone 50 mg PO every day, Valsartan-hydrochlorothiazide 160-12.5 mg PO every day, and Hydralazine 50 mg PO BID Continue current regimen Continue monitoring home BP regularly Refills provided Follow up in 3 months or sooner if concerns arise        Relevant Medications   spironolactone (ALDACTONE) 50 MG tablet   hydrALAZINE (APRESOLINE) 25 MG tablet   pravastatin (PRAVACHOL) 20 MG tablet   amLODipine (NORVASC) 10 MG tablet   valsartan-hydrochlorothiazide (DIOVAN-HCT) 160-12.5 MG tablet     Endocrine   Controlled type 2 diabetes mellitus with complication, without long-term current use of insulin (HCC) - Primary    Chronic, historic condition Appears well controlled on current regimen comprised of Metformin 500 mg PO BID and Jardiance 25 mg PO every day Continue current regimen Recheck A1c today for monitoring- results to dictate further management Follow up in 3 months or sooner if concerns arise         Relevant Medications   pravastatin (PRAVACHOL) 20 MG tablet   valsartan-hydrochlorothiazide (DIOVAN-HCT) 160-12.5 MG tablet   Other Relevant Orders   Bayer DCA Hb A1c Waived (STAT) (Completed)     Other   Hyperlipidemia    Chronic.  Controlled.  Continue with current medication regimen of Pravastatin.  Labs ordered today.  Return to clinic in 3 months for reevaluation.  Call sooner if concerns arise.        Relevant Medications   spironolactone (ALDACTONE) 50 MG tablet   hydrALAZINE (APRESOLINE) 25 MG tablet   pravastatin (PRAVACHOL) 20 MG tablet   amLODipine (NORVASC) 10 MG tablet   valsartan-hydrochlorothiazide (DIOVAN-HCT) 160-12.5 MG tablet     Return in about 3 months (around 08/30/2023) for HTN, Diabetes follow up.   I,  E , PA-C, have reviewed all documentation for this visit. The documentation on 05/31/23 for the exam, diagnosis, procedures, and orders are all accurate and complete.   Jacquelin Hawking, MHS, PA-C [facility]

## 2023-05-30 NOTE — Telephone Encounter (Signed)
Requested medications are due for refill today.  No  - Jason King now taking 50 mg, which should be due for refill now  Requested medications are on the active medications list.  No, but 50 mg is  Last refill. 50 mg refilled 11/22/2022 #90 1 rf  Future visit scheduled.   yes  Notes to clinic.  Jason King is now taking 50mg . 50 mg should be due for refill.     Requested Prescriptions  Pending Prescriptions Disp Refills   spironolactone (ALDACTONE) 25 MG tablet [Pharmacy Med Name: SPIRONOLACTONE 25 MG TABLET] 30 tablet 5    Sig: TAKE 1 TABLET (25 MG TOTAL) BY MOUTH DAILY.     Cardiovascular: Diuretics - Aldosterone Antagonist Failed - 05/27/2023  5:28 PM      Failed - Cr in normal range and within 180 days    Creatinine, Ser  Date Value Ref Range Status  02/21/2023 1.71 (H) 0.76 - 1.27 mg/dL Final         Passed - K in normal range and within 180 days    Potassium  Date Value Ref Range Status  02/21/2023 3.5 3.5 - 5.2 mmol/L Final         Passed - Na in normal range and within 180 days    Sodium  Date Value Ref Range Status  02/21/2023 141 134 - 144 mmol/L Final         Passed - eGFR is 30 or above and within 180 days    eGFR  Date Value Ref Range Status  02/21/2023 50 (L) >59 mL/min/1.73 Final         Passed - Last BP in normal range    BP Readings from Last 1 Encounters:  02/21/23 136/84         Passed - Valid encounter within last 6 months    Recent Outpatient Visits           3 months ago Controlled type 2 diabetes mellitus with complication, without long-term current use of insulin (HCC)   [place] [name], [facility]   6 months ago Controlled type 2 diabetes mellitus with complication, without long-term current use of insulin (HCC)   [place] [facility] [name], [facility]   8 months ago Primary hypertension   [place] [name], [facility]   9 months ago Annual physical exam   [place]  [name], [facility]   1 year ago Controlled type 2 diabetes mellitus with complication, without long-term current use of insulin (HCC)   [place] [facility] [name], NP       Future Appointments             Today Mecum, Oswaldo Conroy, PA-C [facility]            Signed Prescriptions Disp Refills   empagliflozin (JARDIANCE) 25 MG TABS tablet 90 tablet 0    Sig: TAKE 1 TABLET BY MOUTH DAILY BEFORE BREAKFAST.     Endocrinology:  Diabetes - SGLT2 Inhibitors Failed - 05/27/2023  5:28 PM      Failed - Cr in normal range and within 360 days    Creatinine, Ser  Date Value Ref Range Status  02/21/2023 1.71 (H) 0.76 - 1.27 mg/dL Final         Failed - eGFR in normal range and within 360 days    eGFR  Date Value Ref Range Status  02/21/2023 50 (L) >59 mL/min/1.73 Final         Passed - HBA1C is between 0 and 7.9 and within 180 days    Hgb A1c MFr Bld  Date Value Ref Range Status  02/21/2023 6.3 (H) 4.8 - 5.6 % Final    Comment:             Prediabetes: 5.7 - 6.4          Diabetes: >6.4          Glycemic control for adults with diabetes: <7.0          Passed - Valid encounter within last 6 months    Recent Outpatient Visits           3 months ago Controlled type 2 diabetes mellitus with complication, without long-term current use of insulin (HCC)   [place] [facility] [name], NP   6 months ago Controlled type 2 diabetes mellitus with complication, without long-term current use of insulin (HCC)   [place] [facility] [name], NP   8 months ago Primary hypertension   [place] [facility] [name], NP   9 months ago Annual physical exam   [place] [name], NP   1 year ago Controlled type 2 diabetes mellitus with complication, without long-term current use of insulin (HCC)   [facility] [name], [facility]       Future Appointments             Today Mecum, Oswaldo Conroy, PA-C [facility]            Refused Prescriptions Disp Refills   metFORMIN (GLUCOPHAGE-XR) 500 MG 24 hr tablet [Pharmacy Med Name: METFORMIN HCL ER 500 MG TABLET] 30 tablet 5    Sig: TAKE 1 TABLET BY MOUTH EVERY DAY WITH BREAKFAST     Endocrinology:  Diabetes - Biguanides Failed - 05/27/2023  5:28 PM      Failed - Cr in normal range and within 360 days    Creatinine, Ser  Date Value Ref Range Status  02/21/2023 1.71 (H) 0.76 - 1.27 mg/dL Final         Failed - eGFR in normal range and within 360 days    eGFR  Date Value Ref Range Status  02/21/2023 50 (L) >59 mL/min/1.73 Final         Failed - B12 Level in normal range and within 720 days    No results found for: "VITAMINB12"       Passed - HBA1C is between 0 and 7.9 and within 180 days    Hgb A1c MFr Bld  Date Value Ref Range Status  02/21/2023 6.3 (H) 4.8 - 5.6 % Final    Comment:             Prediabetes: 5.7 - 6.4          Diabetes: >6.4          Glycemic control for adults with diabetes: <7.0          Passed - Valid encounter within last 6 months    Recent Outpatient Visits           3 months ago Controlled type 2 diabetes mellitus with complication, without long-term current use of insulin [facility])   [place] [facility] [name], NP   6 months ago Controlled type 2 diabetes mellitus with complication,  without long-term current use of insulin (HCC)   [place] [facility] [name], NP   8 months ago Primary hypertension   [place] [facility] [name], NP   9 months ago Annual physical exam   [place] [facility] [name], NP   1 year ago Controlled type 2 diabetes mellitus with complication, without long-term current use of insulin (HCC)    [facility] [name], NP       Future Appointments             Today Mecum, Oswaldo Conroy, PA-C  [facility]            Passed - CBC within normal limits and completed in the last 12 months    WBC  Date Value Ref Range Status  02/21/2023 8.0 3.4 - 10.8 x10E3/uL Final   RBC  Date Value Ref Range Status  02/21/2023 6.01 (H) 4.14 - 5.80 x10E6/uL Final   Hemoglobin  Date Value Ref Range Status  02/21/2023 16.1 13.0 - 17.7 g/dL Final   Hematocrit  Date Value Ref Range Status  02/21/2023 50.6 37.5 - 51.0 % Final   MCHC  Date Value Ref Range Status  02/21/2023 31.8 31.5 - 35.7 g/dL Final   [facility]  Date Value Ref Range Status  02/21/2023 26.8 26.6 - 33.0 pg Final   MCV  Date Value Ref Range Status  02/21/2023 84 79 - 97 fL Final   No results found for: "PLTCOUNTKUC", "LABPLAT", "POCPLA" RDW  Date Value Ref Range Status  02/21/2023 13.8 11.6 - 15.4 % Final

## 2023-05-30 NOTE — Telephone Encounter (Signed)
Requested Prescriptions  Pending Prescriptions Disp Refills   spironolactone (ALDACTONE) 25 MG tablet [Pharmacy Med Name: SPIRONOLACTONE 25 MG TABLET] 30 tablet 5    Sig: TAKE 1 TABLET (25 MG TOTAL) BY MOUTH DAILY.     Cardiovascular: Diuretics - Aldosterone Antagonist Failed - 05/27/2023  5:28 PM      Failed - Cr in normal range and within 180 days    Creatinine, Ser  Date Value Ref Range Status  02/21/2023 1.71 (H) 0.76 - 1.27 mg/dL Final         Passed - K in normal range and within 180 days    Potassium  Date Value Ref Range Status  02/21/2023 3.5 3.5 - 5.2 mmol/L Final         Passed - Na in normal range and within 180 days    Sodium  Date Value Ref Range Status  02/21/2023 141 134 - 144 mmol/L Final         Passed - eGFR is 30 or above and within 180 days    eGFR  Date Value Ref Range Status  02/21/2023 50 (L) >59 mL/min/1.73 Final         Passed - Last BP in normal range    BP Readings from Last 1 Encounters:  02/21/23 136/84         Passed - Valid encounter within last 6 months    Recent Outpatient Visits           3 months ago Controlled type 2 diabetes mellitus with complication, without long-term current use of insulin (HCC)   [place] [facility] [name], NP   6 months ago Controlled type 2 diabetes mellitus with complication, without long-term current use of insulin (HCC)   [place] [facility] [name], NP   8 months ago Primary hypertension   [place] [facility] [name], NP   9 months ago Annual physical exam   [place] [facility] [name], NP   1 year ago Controlled type 2 diabetes mellitus with complication, without long-term current use of insulin (HCC)    [facility] [name], NP       Future Appointments             Today Mecum, Oswaldo Conroy, PA-C  [facility]              JARDIANCE 25 MG TABS tablet [Pharmacy Med Name: JARDIANCE 25 MG TABLET] 30 tablet 5    Sig: TAKE 1 TABLET BY MOUTH DAILY BEFORE BREAKFAST.     Endocrinology:  Diabetes - SGLT2 Inhibitors Failed - 05/27/2023  5:28 PM      Failed - Cr in normal range and within 360 days    Creatinine, Ser  Date Value Ref Range Status  02/21/2023 1.71 (H) 0.76 - 1.27 mg/dL Final         Failed - eGFR in normal range and within 360 days    eGFR  Date Value Ref Range Status  02/21/2023 50 (L) >59 mL/min/1.73 Final         Passed - HBA1C is between 0 and 7.9 and within 180 days    Hgb A1c MFr Bld  Date Value Ref Range Status  02/21/2023 6.3 (H) 4.8 - 5.6 % Final    Comment:             Prediabetes: 5.7 - 6.4  Diabetes: >6.4          Glycemic control for adults with diabetes: <7.0          Passed - Valid encounter within last 6 months    Recent Outpatient Visits           3 months ago Controlled type 2 diabetes mellitus with complication, without long-term current use of insulin (HCC)   [place] [facility] [name], NP   6 months ago Controlled type 2 diabetes mellitus with complication, without long-term current use of insulin (HCC)   [place] [facility] [name], NP   8 months ago Primary hypertension   [place] [facility] [name], NP   9 months ago Annual physical exam   [place] [facility] [name], NP   1 year ago Controlled type 2 diabetes mellitus with complication, without long-term current use of insulin (HCC)   [place] [facility] [name], NP       Future Appointments             Today Mecum, Oswaldo Conroy, PA-C [place] [facility]             metFORMIN (GLUCOPHAGE-XR) 500 MG 24 hr tablet [Pharmacy Med Name: METFORMIN HCL ER 500 MG TABLET] 30 tablet 5    Sig: TAKE 1 TABLET BY MOUTH EVERY DAY WITH BREAKFAST      Endocrinology:  Diabetes - Biguanides Failed - 05/27/2023  5:28 PM      Failed - Cr in normal range and within 360 days    Creatinine, Ser  Date Value Ref Range Status  02/21/2023 1.71 (H) 0.76 - 1.27 mg/dL Final         Failed - eGFR in normal range and within 360 days    eGFR  Date Value Ref Range Status  02/21/2023 50 (L) >59 mL/min/1.73 Final         Failed - B12 Level in normal range and within 720 days    No results found for: "VITAMINB12"       Passed - HBA1C is between 0 and 7.9 and within 180 days    Hgb A1c MFr Bld  Date Value Ref Range Status  02/21/2023 6.3 (H) 4.8 - 5.6 % Final    Comment:             Prediabetes: 5.7 - 6.4          Diabetes: >6.4          Glycemic control for adults with diabetes: <7.0          Passed - Valid encounter within last 6 months    Recent Outpatient Visits           3 months ago Controlled type 2 diabetes mellitus with complication, without long-term current use of insulin (HCC)   [place] [facility] [name], NP   6 months ago Controlled type 2 diabetes mellitus with complication, without long-term current use of insulin (HCC)   [place] [facility] [name], NP   8 months ago Primary hypertension   [place] [facility] [name], NP   9 months ago Annual physical exam   [place] [facility] [name], NP   1 year ago Controlled type 2 diabetes mellitus with complication, without long-term current use of insulin [facility])   [place] [facility] [name], NP  Future Appointments             Today Mecum, Oswaldo Conroy, PA-C [place] [facility]            Passed - CBC within normal limits and completed in the last 12 months    WBC  Date Value Ref Range Status  02/21/2023 8.0 3.4 - 10.8 x10E3/uL Final   RBC  Date Value Ref Range Status  02/21/2023 6.01 (H) 4.14 - 5.80 x10E6/uL  Final   Hemoglobin  Date Value Ref Range Status  02/21/2023 16.1 13.0 - 17.7 g/dL Final   Hematocrit  Date Value Ref Range Status  02/21/2023 50.6 37.5 - 51.0 % Final   MCHC  Date Value Ref Range Status  02/21/2023 31.8 31.5 - 35.7 g/dL Final   [facility]  Date Value Ref Range Status  02/21/2023 26.8 26.6 - 33.0 pg Final   MCV  Date Value Ref Range Status  02/21/2023 84 79 - 97 fL Final   No results found for: "PLTCOUNTKUC", "LABPLAT", "POCPLA" RDW  Date Value Ref Range Status  02/21/2023 13.8 11.6 - 15.4 % Final

## 2023-05-31 NOTE — Assessment & Plan Note (Signed)
Chronic, historic condition  Appears well controlled on current regimen comprised of Amlodipine 10 mg PO every day, Spironolactone 50 mg PO every day, Valsartan-hydrochlorothiazide 160-12.5 mg PO every day, and Hydralazine 50 mg PO BID Continue current regimen Continue monitoring home BP regularly Refills provided Follow up in 3 months or sooner if concerns arise

## 2023-05-31 NOTE — Assessment & Plan Note (Signed)
Chronic, historic condition Appears well controlled on current regimen comprised of Metformin 500 mg PO BID and Jardiance 25 mg PO every day Continue current regimen Recheck A1c today for monitoring- results to dictate further management Follow up in 3 months or sooner if concerns arise

## 2023-05-31 NOTE — Progress Notes (Signed)
Your A1c was 6.5 today. This is up a little bit from your previous results but no changes are needed at this time. Please make sure you are watching your sugar intake and reducing excess sugars and carbohydrates.

## 2023-05-31 NOTE — Assessment & Plan Note (Signed)
Chronic.  Controlled.  Continue with current medication regimen of Pravastatin.  Labs ordered today.  Return to clinic in 3 months for reevaluation.  Call sooner if concerns arise.

## 2023-06-13 IMAGING — US US RENAL
1 series · 14 of 25 positions shown · non-contrast
Comparison: None.

CLINICAL DATA: Chronic kidney disease (CKD) stage G3a/A1,
moderately decreased glomerular filtration rate (GFR) between 45-59
mL/min/1.73 square meter and albuminuria creatinine ratio less than
30 mg/g (HCC)

EXAM:
RENAL / URINARY TRACT ULTRASOUND COMPLETE

[Series 1: us renal · 0.23mm/px · 14 of 39 slices shown]
[im 1/39]
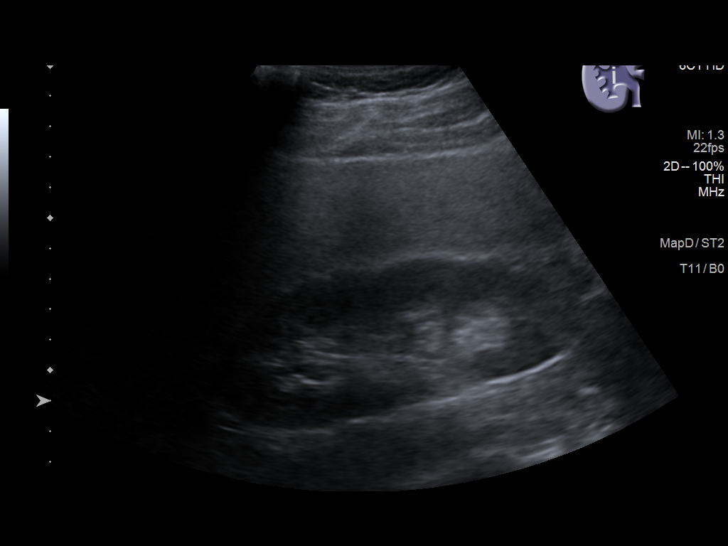
[im 4/39]
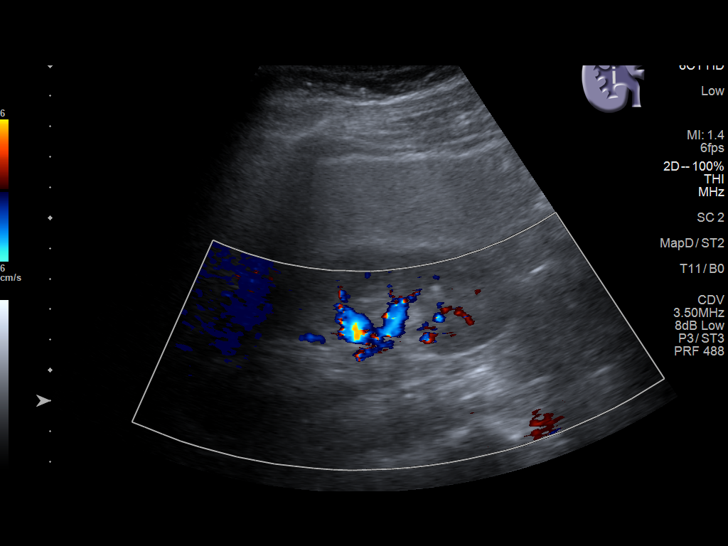
[im 7/39]
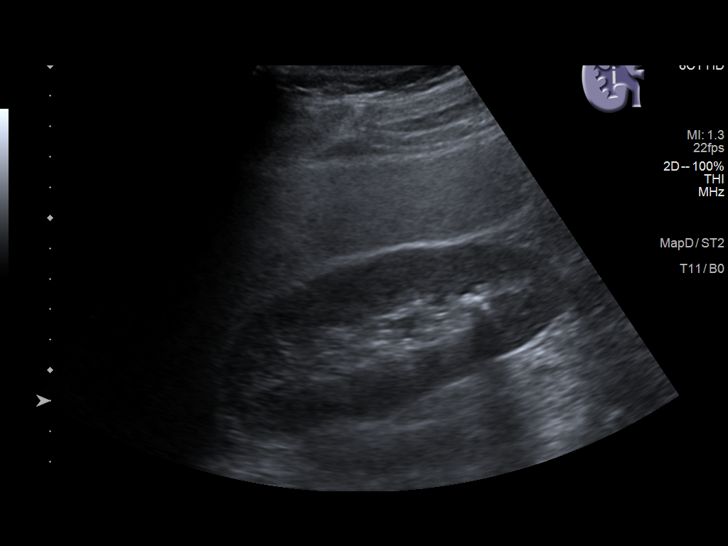
[im 10/39]
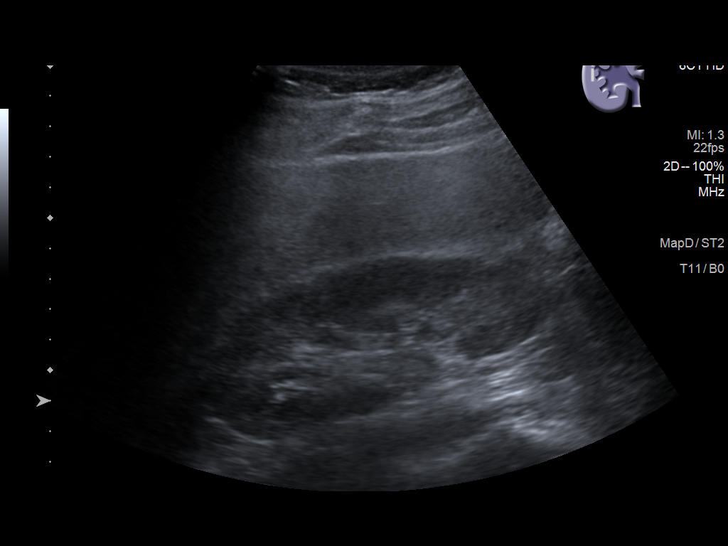
[im 13/39]
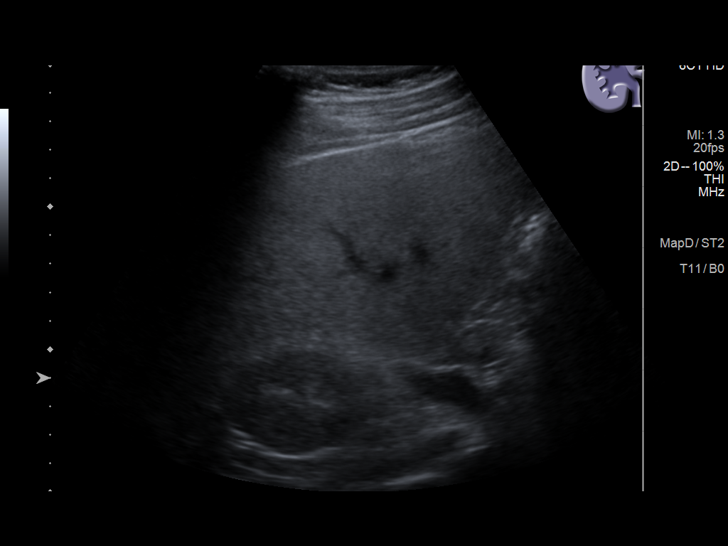
[im 15/39]
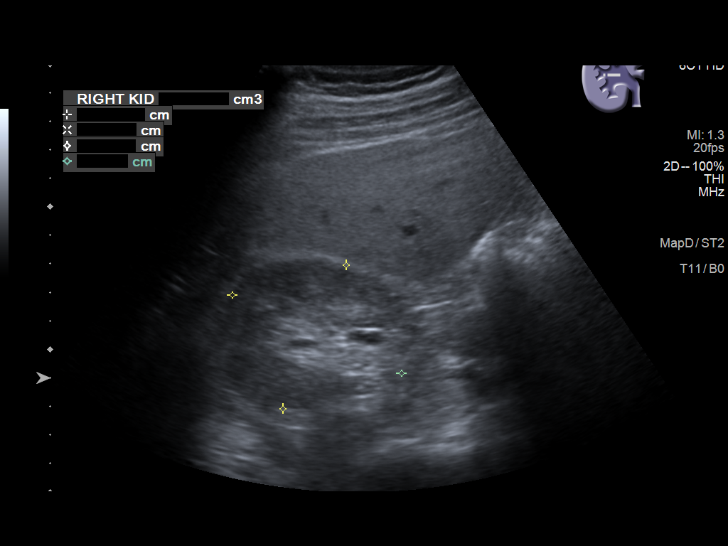
[im 18/39]
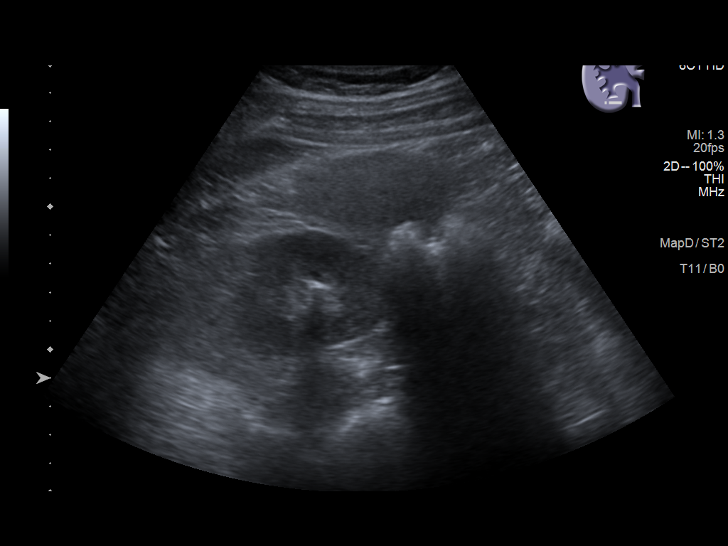
[im 21/39]
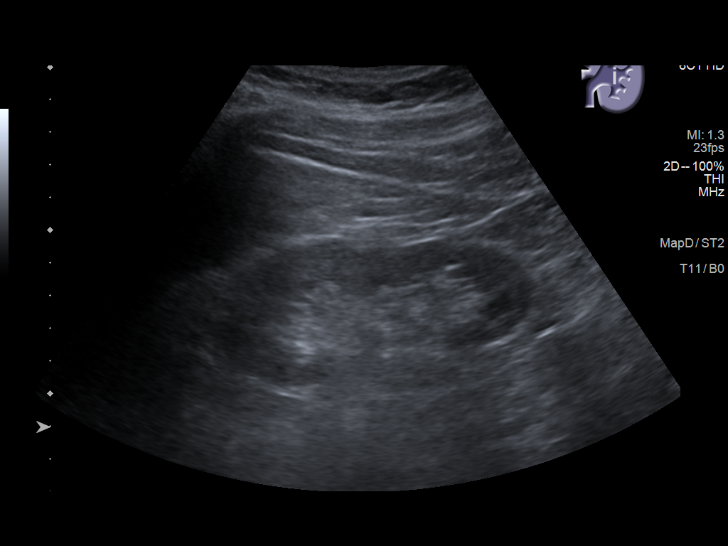
[im 24/39]
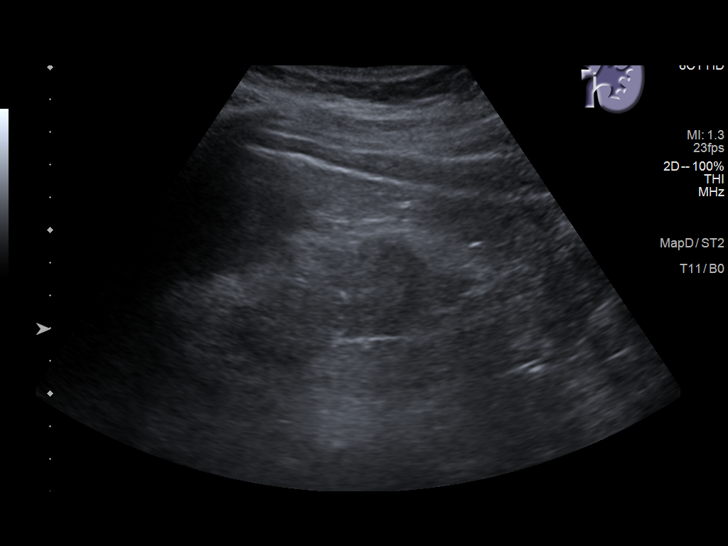
[im 26/39]
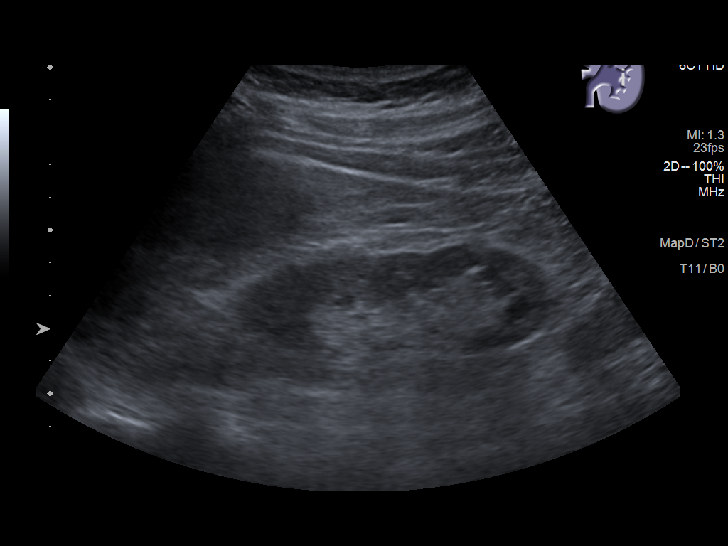
[im 29/39]
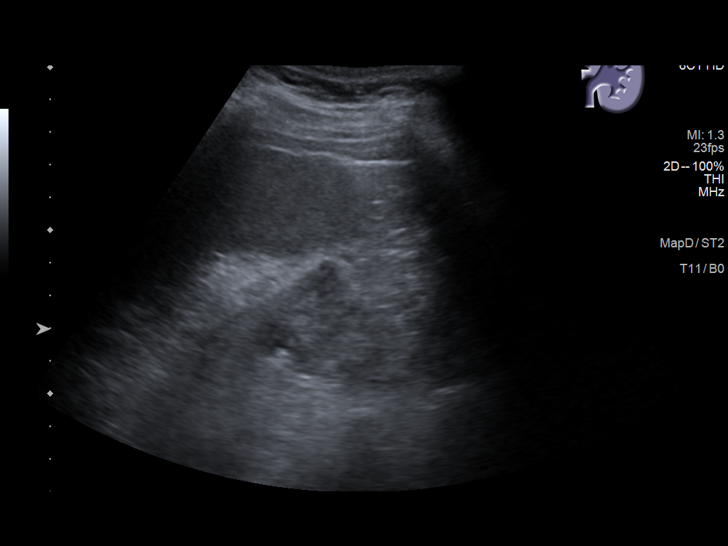
[im 32/39]
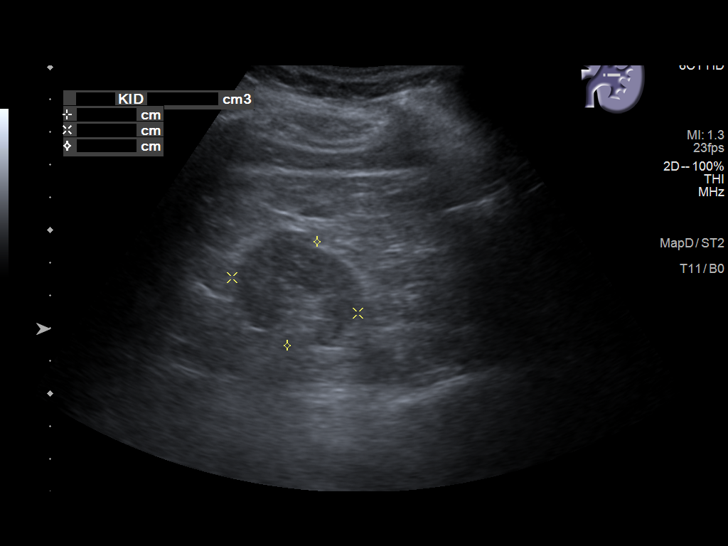
[im 35/39]
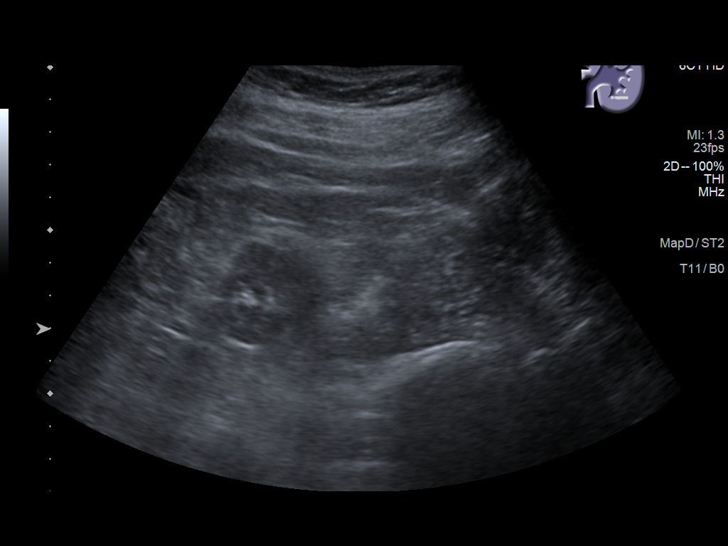
[im 39/39]
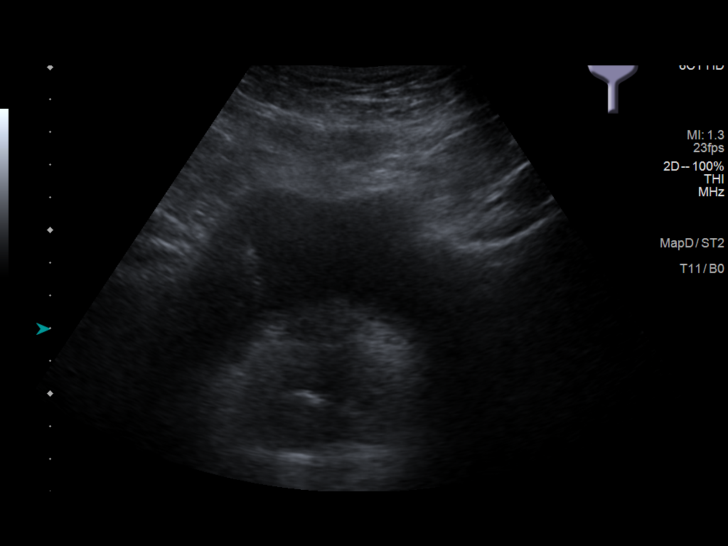

[14 of 25 positions shown; findings below may reference images not displayed]

FINDINGS: Right Kidney:

Renal measurements: 12.2 x 5.5 x 645 cm = volume: 226 mL.
Echogenicity within normal limits. There is an 8 mm calcified stone
within the lower pole. No mass or hydronephrosis visualized.

Left Kidney:

Renal measurements: 9.8 x 4 x 3.3 cm = volume: 68 mL. Echogenicity
within normal limits. No mass or hydronephrosis visualized.

Urinary bladder:

Appears normal for degree of bladder distention.

Other:

None.
IMPRESSION: Nonobstructive 8 mm right nephrolithiasis.

## 2023-06-30 DIAGNOSIS — E119 Type 2 diabetes mellitus without complications: Secondary | ICD-10-CM | POA: Diagnosis not present

## 2023-06-30 DIAGNOSIS — H524 Presbyopia: Secondary | ICD-10-CM | POA: Diagnosis not present

## 2023-06-30 DIAGNOSIS — H43393 Other vitreous opacities, bilateral: Secondary | ICD-10-CM | POA: Diagnosis not present

## 2023-06-30 DIAGNOSIS — H52223 Regular astigmatism, bilateral: Secondary | ICD-10-CM | POA: Diagnosis not present

## 2023-06-30 LAB — HM DIABETES EYE EXAM

## 2023-07-29 DIAGNOSIS — N189 Chronic kidney disease, unspecified: Secondary | ICD-10-CM | POA: Diagnosis not present

## 2023-07-29 DIAGNOSIS — Z8249 Family history of ischemic heart disease and other diseases of the circulatory system: Secondary | ICD-10-CM | POA: Diagnosis not present

## 2023-07-29 DIAGNOSIS — E1122 Type 2 diabetes mellitus with diabetic chronic kidney disease: Secondary | ICD-10-CM | POA: Diagnosis not present

## 2023-07-29 DIAGNOSIS — K219 Gastro-esophageal reflux disease without esophagitis: Secondary | ICD-10-CM | POA: Diagnosis not present

## 2023-07-29 DIAGNOSIS — Z7984 Long term (current) use of oral hypoglycemic drugs: Secondary | ICD-10-CM | POA: Diagnosis not present

## 2023-07-29 DIAGNOSIS — I129 Hypertensive chronic kidney disease with stage 1 through stage 4 chronic kidney disease, or unspecified chronic kidney disease: Secondary | ICD-10-CM | POA: Diagnosis not present

## 2023-07-29 DIAGNOSIS — R160 Hepatomegaly, not elsewhere classified: Secondary | ICD-10-CM | POA: Diagnosis not present

## 2023-07-29 DIAGNOSIS — E785 Hyperlipidemia, unspecified: Secondary | ICD-10-CM | POA: Diagnosis not present

## 2023-07-29 DIAGNOSIS — Z833 Family history of diabetes mellitus: Secondary | ICD-10-CM | POA: Diagnosis not present

## 2023-07-29 DIAGNOSIS — E1165 Type 2 diabetes mellitus with hyperglycemia: Secondary | ICD-10-CM | POA: Diagnosis not present

## 2023-08-17 ENCOUNTER — Ambulatory Visit: Payer: 59 | Admitting: Family Medicine

## 2023-08-17 VITALS — BP 137/81 | HR 85 | Temp 97.9°F | Ht 68.9 in | Wt 194.2 lb

## 2023-08-17 DIAGNOSIS — E118 Type 2 diabetes mellitus with unspecified complications: Secondary | ICD-10-CM | POA: Diagnosis not present

## 2023-08-17 DIAGNOSIS — I10 Essential (primary) hypertension: Secondary | ICD-10-CM

## 2023-08-17 DIAGNOSIS — D1803 Hemangioma of intra-abdominal structures: Secondary | ICD-10-CM

## 2023-08-17 LAB — MICROALBUMIN, URINE WAIVED
Creatinine, Urine Waived: 100 mg/dL (ref 10–300)
Microalb, Ur Waived: 80 mg/L — ABNORMAL HIGH (ref 0–19)

## 2023-08-17 MED ORDER — METFORMIN HCL 500 MG PO TABS: 500.0000 mg | ORAL_TABLET | Freq: Two times a day (BID) | ORAL | 5 refills | Status: AC

## 2023-08-17 NOTE — Assessment & Plan Note (Signed)
Chronic. Stable.  Continue with current medication regimen of Pravastatin.  Labs ordered today.  Return to clinic in 3 months for reevaluation.  Call sooner if concerns arise.

## 2023-08-17 NOTE — Assessment & Plan Note (Addendum)
Chronic, controlled. A1C, CMP done. Continue current regimen of Metformin 500 MG BID and Jardiance 25 MG daily. Will treat based on A1C. Return in 3 months. Call sooner if concerns arise.

## 2023-08-17 NOTE — Patient Instructions (Signed)
Limit/avoid alcohol intake Avoid Tylenol  Exercise at least 150 mins weekly (30 mins for 5 days) Practice DASH; limiting sodium in foods

## 2023-08-17 NOTE — Addendum Note (Signed)
Addended by: Prescott Gum on: 08/17/2023 11:28 AM   Modules accepted: Orders

## 2023-08-17 NOTE — Assessment & Plan Note (Addendum)
Chronic, stable. CMP, CBC, lipid panel, and UA today. Recommend continue current regimen of Amlodipine 10 MG, Spironolactone 50 MG, Valsartan-hydrochlorothiazide 160-12.5 MG, and Hydralazine 50 MG BID. Continue monitoring BP regularly, refills provided. Provided information on DASH diet and recommend 150 mins of physical activity weekly.  Follow up in 3 months or sooner if concerns arise

## 2023-08-17 NOTE — Progress Notes (Signed)
BP 137/81   Pulse 85   Temp 97.9 F (36.6 C) (Oral)   Ht 5' 8.9" (1.75 m)   Wt 194 lb 3.2 oz (88.1 kg)   SpO2 96%   BMI 28.76 kg/m    Subjective:    Patient ID: Jason King, male    DOB: December 04, 1975, 47 y.o.   MRN: [id number]  HPI: Jason King is a 47 y.o. male  Chief Complaint  Patient presents with   Hypertension   Hyperlipidemia   Diabetes   HYPERTENSION / HYPERLIPIDEMIA Taking Amlodipine, Hydralazine, Spirinolactone, Valsartan- Hydrochlorothiazide Satisfied with current treatment? yes Duration of hypertension: chronic BP monitoring frequency: daily BP range: 130-140/80-90 BP medication side effects: no Duration of hyperlipidemia: chronic Cholesterol medication side effects: no Cholesterol supplements: none Medication compliance: excellent compliance Aspirin: no Recent stressors: no Recurrent headaches: no Visual changes: no Palpitations: no Dyspnea: no Chest pain: no Lower extremity edema: no Dizzy/lightheaded: no   DIABETES Taking Metformin and Jardiance Hypoglycemic episodes:no Polydipsia/polyuria: no Visual disturbance: no Chest pain: no Paresthesias: no Glucose Monitoring: yes x1 weekly   Accucheck frequency:  x1 weekly  Post prandial: 135 Taking Insulin?: no Blood Pressure Monitoring: weekly Retinal Examination: Up to Date Foot Exam: Up to Date Diabetic Education: Completed Pneumovax: Not applicalbe Influenza: Up to Date Aspirin: no   Relevant past medical, surgical, family and social history reviewed and updated as indicated. Interim medical history since our last visit reviewed. Allergies and medications reviewed and updated.  Review of Systems  Constitutional: Negative.   Eyes: Negative.  Negative for visual disturbance.  Respiratory: Negative.    Cardiovascular:  Negative for chest pain.  Endocrine: Negative.  Negative for polydipsia, polyphagia and polyuria.  Genitourinary: Negative.  Negative for frequency.  Neurological:   Negative for numbness and headaches.    Per HPI unless specifically indicated above     Objective:    BP 137/81   Pulse 85   Temp 97.9 F (36.6 C) (Oral)   Ht 5' 8.9" (1.75 m)   Wt 194 lb 3.2 oz (88.1 kg)   SpO2 96%   BMI 28.76 kg/m   Wt Readings from Last 3 Encounters:  08/17/23 194 lb 3.2 oz (88.1 kg)  05/30/23 188 lb 3.2 oz (85.4 kg)  02/21/23 183 lb 8 oz (83.2 kg)    Physical Exam Vitals and nursing note reviewed.  Constitutional:      General: He is awake. He is not in acute distress.    Appearance: Normal appearance. He is well-developed and well-groomed. He is obese. He is not ill-appearing.  HENT:     Head: Normocephalic and atraumatic.     Right Ear: Hearing and external ear normal. No drainage.     Left Ear: Hearing and external ear normal. No drainage.     Nose: Nose normal.  Eyes:     General: Lids are normal.        Right eye: No discharge.        Left eye: No discharge.     Conjunctiva/sclera: Conjunctivae normal.  Cardiovascular:     Rate and Rhythm: Normal rate and regular rhythm.     Pulses:          Radial pulses are 2+ on the right side and 2+ on the left side.       Posterior tibial pulses are 2+ on the right side and 2+ on the left side.     Heart sounds: Normal heart sounds, S1 normal and S2  normal. No murmur heard.    No gallop.  Pulmonary:     Effort: Pulmonary effort is normal. No accessory muscle usage or respiratory distress.     Breath sounds: Normal breath sounds. No wheezing, rhonchi or rales.  Musculoskeletal:        General: Normal range of motion.     Cervical back: Full passive range of motion without pain and normal range of motion.     Right lower leg: No edema.     Left lower leg: No edema.  Skin:    General: Skin is warm and dry.     Capillary Refill: Capillary refill takes less than 2 seconds.  Neurological:     Mental Status: He is alert and oriented to person, place, and time.  Psychiatric:        Attention and  Perception: Attention normal.        Mood and Affect: Mood normal.        Speech: Speech normal.        Behavior: Behavior normal. Behavior is cooperative.        Thought Content: Thought content normal.     Results for orders placed or performed in visit on 07/05/23  HM DIABETES EYE EXAM  Result Value Ref Range   HM Diabetic Eye Exam No Retinopathy No Retinopathy      Assessment & Plan:   Problem List Items Addressed This Visit     Primary hypertension    Chronic, stable. CMP, CBC, lipid panel, and UA today. Recommend continue current regimen of Amlodipine 10 MG, Spironolactone 50 MG, Valsartan-hydrochlorothiazide 160-12.5 MG, and Hydralazine 50 MG BID. Continue monitoring BP regularly, refills provided. Provided information on DASH diet and recommend 150 mins of physical activity weekly.  Follow up in 3 months or sooner if concerns arise        Relevant Orders   Comp Met (CMET)   Urine Microalbumin w/creat. ratio   CBC w/Diff   Controlled type 2 diabetes mellitus with complication, without long-term current use of insulin (HCC) - Primary    Chronic, controlled. A1C, CMP done. Continue current regimen of Metformin 500 MG BID and Jardiance 25 MG daily. Will treat based on A1C. Return in 3 months. Call sooner if concerns arise.        Relevant Medications   metFORMIN (GLUCOPHAGE) 500 MG tablet   Other Relevant Orders   Urine Microalbumin w/creat. ratio   HgB A1c   Other Visit Diagnoses     Hemangioma of liver       Relevant Orders   Ambulatory referral to General Surgery        Follow up plan: Return in about 3 months (around 11/17/2023) for Follow up HTN/HLD, DM2.

## 2023-08-18 LAB — HEMOGLOBIN A1C
Est. average glucose Bld gHb Est-mCnc: 137 mg/dL
Hgb A1c MFr Bld: 6.4 % — ABNORMAL HIGH (ref 4.8–5.6)

## 2023-08-18 LAB — CBC WITH DIFFERENTIAL/PLATELET
Basophils Absolute: 0.1 10*3/uL (ref 0.0–0.2)
Basos: 1 %
EOS (ABSOLUTE): 0.1 10*3/uL (ref 0.0–0.4)
Eos: 2 %
Hematocrit: 52.5 % — ABNORMAL HIGH (ref 37.5–51.0)
Hemoglobin: 16.8 g/dL (ref 13.0–17.7)
Immature Grans (Abs): 0 10*3/uL (ref 0.0–0.1)
Immature Granulocytes: 0 %
Lymphocytes Absolute: 1.7 10*3/uL (ref 0.7–3.1)
Lymphs: 21 %
MCH: 27.1 pg (ref 26.6–33.0)
MCHC: 32 g/dL (ref 31.5–35.7)
MCV: 85 fL (ref 79–97)
Monocytes Absolute: 0.5 10*3/uL (ref 0.1–0.9)
Monocytes: 6 %
Neutrophils Absolute: 5.8 10*3/uL (ref 1.4–7.0)
Neutrophils: 70 %
Platelets: 221 10*3/uL (ref 150–450)
RBC: 6.21 x10E6/uL — ABNORMAL HIGH (ref 4.14–5.80)
RDW: 14.5 % (ref 11.6–15.4)
WBC: 8.2 10*3/uL (ref 3.4–10.8)

## 2023-08-18 LAB — COMPREHENSIVE METABOLIC PANEL
ALT: 31 [IU]/L (ref 0–44)
AST: 15 [IU]/L (ref 0–40)
Albumin: 4.6 g/dL (ref 4.1–5.1)
Alkaline Phosphatase: 80 [IU]/L (ref 44–121)
BUN/Creatinine Ratio: 16 (ref 9–20)
BUN: 30 mg/dL — ABNORMAL HIGH (ref 6–24)
Bilirubin Total: 0.6 mg/dL (ref 0.0–1.2)
CO2: 23 mmol/L (ref 20–29)
Calcium: 9.6 mg/dL (ref 8.7–10.2)
Chloride: 104 mmol/L (ref 96–106)
Creatinine, Ser: 1.89 mg/dL — ABNORMAL HIGH (ref 0.76–1.27)
Globulin, Total: 2.3 g/dL (ref 1.5–4.5)
Glucose: 147 mg/dL — ABNORMAL HIGH (ref 70–99)
Potassium: 3.6 mmol/L (ref 3.5–5.2)
Sodium: 142 mmol/L (ref 134–144)
Total Protein: 6.9 g/dL (ref 6.0–8.5)
eGFR: 44 mL/min/{1.73_m2} — ABNORMAL LOW (ref >59)

## 2023-08-22 NOTE — Progress Notes (Signed)
Hi Jason King,  Your electrolytes and liver function remain stable and normal. Your kidney function is stable. There is no indication of anemia, your red blood cells are slightly elevated along with your hematocrit, we will recheck this level at your next visit. Your A1C remains stable at 6.4%, maintaining your goal of less than 7%. Continue taking medications as prescribed. Thank you for allowing me to participate in your care.

## 2023-09-05 ENCOUNTER — Ambulatory Visit: Payer: 59 | Admitting: Nurse Practitioner

## 2023-09-05 ENCOUNTER — Other Ambulatory Visit: Payer: Self-pay | Admitting: Nurse Practitioner

## 2023-09-06 NOTE — Telephone Encounter (Signed)
Requested Prescriptions  Pending Prescriptions Disp Refills   empagliflozin (JARDIANCE) 25 MG TABS tablet [Pharmacy Med Name: JARDIANCE 25 MG TABLET] 90 tablet 1    Sig: TAKE 1 TABLET BY MOUTH EVERY DAY BEFORE BREAKFAST     Endocrinology:  Diabetes - SGLT2 Inhibitors Failed - 09/05/2023  2:53 AM      Failed - Cr in normal range and within 360 days    Creatinine, Ser  Date Value Ref Range Status  08/17/2023 1.89 (H) 0.76 - 1.27 mg/dL Final         Failed - eGFR in normal range and within 360 days    eGFR  Date Value Ref Range Status  08/17/2023 44 (L) >59 mL/min/1.73 Final         Passed - HBA1C is between 0 and 7.9 and within 180 days    HB A1C (BAYER DCA - WAIVED)  Date Value Ref Range Status  05/30/2023 6.5 (H) 4.8 - 5.6 % Final    Comment:             Prediabetes: 5.7 - 6.4          Diabetes: >6.4          Glycemic control for adults with diabetes: <7.0    Hgb A1c MFr Bld  Date Value Ref Range Status  08/17/2023 6.4 (H) 4.8 - 5.6 % Final    Comment:             Prediabetes: 5.7 - 6.4          Diabetes: >6.4          Glycemic control for adults with diabetes: <7.0          Passed - Valid encounter within last 6 months    Recent Outpatient Visits           2 weeks ago Controlled type 2 diabetes mellitus with complication, without long-term current use of insulin (HCC)   [place] [facility] Pearley, Sherran Needs, NP   3 months ago Controlled type 2 diabetes mellitus with complication, without long-term current use of insulin (HCC)   [place] [facility] Mecum, Erin E, PA-C   6 months ago Controlled type 2 diabetes mellitus with complication, without long-term current use of insulin (HCC)   [facility] [name], NP   9 months ago Controlled type 2 diabetes mellitus with complication, without long-term current use of insulin [facility])   [place] [facility] [name], NP   11  months ago Primary hypertension   [place] [facility] [name], NP       Future Appointments             In 2 months [name], NP  [facility]

## 2023-11-21 ENCOUNTER — Ambulatory Visit: Payer: 59 | Admitting: Nurse Practitioner

## 2023-11-21 NOTE — Progress Notes (Deleted)
 There were no vitals taken for this visit.   Subjective:    Patient ID: Jason King, male    DOB: 1976/08/08, 48 y.o.   MRN: [id number]  HPI: Jason King is a 48 y.o. male presenting on 11/21/2023 for comprehensive medical examination. Current medical complaints include:{Blank single:19197::none,***}  He currently lives with: Interim Problems from his last visit: {Blank single:19197::yes,no}  HYPERTENSION / HYPERLIPIDEMIA Taking Amlodipine , Hydralazine , Spirinolactone, Valsartan - Hydrochlorothiazide  Satisfied with current treatment? yes Duration of hypertension: chronic BP monitoring frequency: daily BP range: 130-140/80-90 BP medication side effects: no Duration of hyperlipidemia: chronic Cholesterol medication side effects: no Cholesterol supplements: none Medication compliance: excellent compliance Aspirin: no Recent stressors: no Recurrent headaches: no Visual changes: no Palpitations: no Dyspnea: no Chest pain: no Lower extremity edema: no Dizzy/lightheaded: no   DIABETES Taking Metformin  and Jardiance  Hypoglycemic episodes:no Polydipsia/polyuria: no Visual disturbance: no Chest pain: no Paresthesias: no Glucose Monitoring: yes x1 weekly   Accucheck frequency: x1 weekly  Post prandial: 135 Taking Insulin?: no Blood Pressure Monitoring: weekly Retinal Examination: Up to Date Foot Exam: Up to Date Diabetic Education: Completed Pneumovax: Not applicalbe Influenza: Up to Date Aspirin: no   Depression Screen done today and results listed below:     08/17/2023    9:29 AM 05/30/2023   10:20 AM 02/21/2023    9:40 AM 11/22/2022   10:02 AM 09/20/2022    9:46 AM  Depression screen PHQ 2/9  Decreased Interest 0 0 0 0 0  Down, Depressed, Hopeless 0 0 0 0 0  PHQ - 2 Score 0 0 0 0 0  Altered sleeping 0 0 0 0 0  Tired, decreased energy 0 0 0 0 0  Change in appetite 0 0 0 0 0  Feeling bad or failure about yourself  0 0 0 0 0  Trouble concentrating 0 0 0 0 0   Moving slowly or fidgety/restless 0 0 0 0 0  Suicidal thoughts 0 0 0 0 0  PHQ-9 Score 0 0 0 0 0  Difficult doing work/chores Not difficult at all Not difficult at all  Not difficult at all Not difficult at all    The patient {has/does not yjcz:80150} a history of falls. I {did/did not:19850} complete a risk assessment for falls. A plan of care for falls {was/was not:19852} documented.   Past Medical History:  Past Medical History:  Diagnosis Date   Hyperlipidemia    Hypertension     Surgical History:  Past Surgical History:  Procedure Laterality Date   REFRACTIVE SURGERY Bilateral     Medications:  Current Outpatient Medications on File Prior to Visit  Medication Sig   amLODipine  (NORVASC ) 10 MG tablet Take 1 tablet (10 mg total) by mouth daily.   chlorhexidine (PERIDEX) 0.12 % solution SMARTSIG:0.5 Ounce(s) By Mouth Morning-Night   ciclopirox  (PENLAC ) 8 % solution Apply topically at bedtime. Apply over nail and surrounding skin. Apply daily over previous coat. After seven (7) days, may remove with alcohol and continue cycle.   empagliflozin  (JARDIANCE ) 25 MG TABS tablet TAKE 1 TABLET BY MOUTH EVERY DAY BEFORE BREAKFAST   hydrALAZINE  (APRESOLINE ) 25 MG tablet Take 2 tablets (50 mg total) by mouth 2 (two) times daily.   metFORMIN  (GLUCOPHAGE ) 500 MG tablet Take 1 tablet (500 mg total) by mouth 2 (two) times daily with a meal.   omeprazole  (PRILOSEC) 20 MG capsule Take 1 capsule (20 mg total) by mouth daily.   pravastatin  (PRAVACHOL ) 20 MG tablet Take 1 tablet (  20 mg total) by mouth daily.   spironolactone  (ALDACTONE ) 50 MG tablet Take 1 tablet (50 mg total) by mouth daily.   valsartan -hydrochlorothiazide  (DIOVAN -HCT) 160-12.5 MG tablet Take 1 tablet by mouth daily.   No current facility-administered medications on file prior to visit.    Allergies:  No Known Allergies  Social History:  Social History   Socioeconomic History   Marital status: Married    Spouse name: Not  on file   Number of children: Not on file   Years of education: Not on file   Highest education level: Not on file  Occupational History   Not on file  Tobacco Use   Smoking status: Former    Types: Cigarettes   Smokeless tobacco: Former  Building Services Engineer status: Never Used  Substance and Sexual Activity   Alcohol use: Not Currently   Drug use: Never   Sexual activity: Not Currently  Other Topics Concern   Not on file  Social History Narrative   Not on file   Social Drivers of Health   Financial Resource Strain: Not on file  Food Insecurity: Not on file  Transportation Needs: Not on file  Physical Activity: Not on file  Stress: Not on file  Social Connections: Not on file  Intimate Partner Violence: Not on file   Social History   Tobacco Use  Smoking Status Former   Types: Cigarettes  Smokeless Tobacco Former   Social History   Substance and Sexual Activity  Alcohol Use Not Currently    Family History:  No family history on file.  Past medical history, surgical history, medications, allergies, family history and social history reviewed with patient today and changes made to appropriate areas of the chart.   ROS All other ROS negative except what is listed above and in the HPI.      Objective:    There were no vitals taken for this visit.  Wt Readings from Last 3 Encounters:  08/17/23 194 lb 3.2 oz (88.1 kg)  05/30/23 188 lb 3.2 oz (85.4 kg)  02/21/23 183 lb 8 oz (83.2 kg)    Physical Exam  Results for orders placed or performed in visit on 08/17/23  Comp Met (CMET)   Collection Time: 08/17/23 10:00 AM  Result Value Ref Range   Glucose 147 (H) 70 - 99 mg/dL   BUN 30 (H) 6 - 24 mg/dL   Creatinine, Ser 8.10 (H) 0.76 - 1.27 mg/dL   eGFR 44 (L) >40 fO/fpw/8.26   BUN/Creatinine Ratio 16 9 - 20   Sodium 142 134 - 144 mmol/L   Potassium 3.6 3.5 - 5.2 mmol/L   Chloride 104 96 - 106 mmol/L   CO2 23 20 - 29 mmol/L   Calcium 9.6 8.7 - 10.2 mg/dL    Total Protein 6.9 6.0 - 8.5 g/dL   Albumin 4.6 4.1 - 5.1 g/dL   Globulin, Total 2.3 1.5 - 4.5 g/dL   Bilirubin Total 0.6 0.0 - 1.2 mg/dL   Alkaline Phosphatase 80 44 - 121 IU/L   AST 15 0 - 40 IU/L   ALT 31 0 - 44 IU/L  CBC w/Diff   Collection Time: 08/17/23 10:00 AM  Result Value Ref Range   WBC 8.2 3.4 - 10.8 x10E3/uL   RBC 6.21 (H) 4.14 - 5.80 x10E6/uL   Hemoglobin 16.8 13.0 - 17.7 g/dL   Hematocrit 47.4 (H) 62.4 - 51.0 %   MCV 85 79 - 97 fL   MCH 27.1  26.6 - 33.0 pg   MCHC 32.0 31.5 - 35.7 g/dL   RDW 85.4 88.3 - 84.5 %   Platelets 221 150 - 450 x10E3/uL   Neutrophils 70 Not Estab. %   Lymphs 21 Not Estab. %   Monocytes 6 Not Estab. %   Eos 2 Not Estab. %   Basos 1 Not Estab. %   Neutrophils Absolute 5.8 1.4 - 7.0 x10E3/uL   Lymphocytes Absolute 1.7 0.7 - 3.1 x10E3/uL   Monocytes Absolute 0.5 0.1 - 0.9 x10E3/uL   EOS (ABSOLUTE) 0.1 0.0 - 0.4 x10E3/uL   Basophils Absolute 0.1 0.0 - 0.2 x10E3/uL   Immature Granulocytes 0 Not Estab. %   Immature Grans (Abs) 0.0 0.0 - 0.1 x10E3/uL  HgB A1c   Collection Time: 08/17/23 10:00 AM  Result Value Ref Range   Hgb A1c MFr Bld 6.4 (H) 4.8 - 5.6 %   Est. average glucose Bld gHb Est-mCnc 137 mg/dL  Microalbumin, Urine Waived   Collection Time: 08/17/23 11:29 AM  Result Value Ref Range   Microalb, Ur Waived 80 (H) 0 - 19 mg/L   Creatinine, Urine Waived 100 10 - 300 mg/dL   Microalb/Creat Ratio 30-300 (H) <30 mg/g      Assessment & Plan:   Problem List Items Addressed This Visit       Cardiovascular and Mediastinum   Primary hypertension - Primary     Endocrine   Controlled type 2 diabetes mellitus with complication, without long-term current use of insulin (HCC)     Genitourinary   Chronic kidney disease, stage 3a (HCC)     Other   Hyperlipidemia     Discussed aspirin prophylaxis for myocardial infarction prevention and decision was {Blank single:19197::it was not indicated,made to continue ASA,made to start  ASA,made to stop ASA,that we recommended ASA, and patient refused}  LABORATORY TESTING:  Health maintenance labs ordered today as discussed above.   The natural history of prostate cancer and ongoing controversy regarding screening and potential treatment outcomes of prostate cancer has been discussed with the patient. The meaning of a false positive PSA and a false negative PSA has been discussed. He indicates understanding of the limitations of this screening test and wishes *** to proceed with screening PSA testing.   IMMUNIZATIONS:   - Tdap: Tetanus vaccination status reviewed: {tetanus status:315746}. - Influenza: {Blank single:19197::Up to date,Administered today,Postponed to flu season,Refused,Given elsewhere} - Pneumovax: {Blank single:19197::Up to date,Administered today,Not applicable,Refused,Given elsewhere} - Prevnar: {Blank single:19197::Up to date,Administered today,Not applicable,Refused,Given elsewhere} - COVID: {Blank single:19197::Up to date,Administered today,Not applicable,Refused,Given elsewhere} - HPV: {Blank single:19197::Up to date,Administered today,Not applicable,Refused,Given elsewhere} - Shingrix vaccine: {Blank single:19197::Up to date,Administered today,Not applicable,Refused,Given elsewhere}  SCREENING: - Colonoscopy: {Blank single:19197::Up to date,Ordered today,Not applicable,Refused,Done elsewhere}  Discussed with patient purpose of the colonoscopy is to detect colon cancer at curable precancerous or early stages   - AAA Screening: {Blank single:19197::Up to date,Ordered today,Not applicable,Refused,Done elsewhere}  -Hearing Test: {Blank single:19197::Up to date,Ordered today,Not applicable,Refused,Done elsewhere}  -Spirometry: {Blank single:19197::Up to date,Ordered today,Not applicable,Refused,Done elsewhere}   PATIENT COUNSELING:    Sexuality:  Discussed sexually transmitted diseases, partner selection, use of condoms, avoidance of unintended pregnancy  and contraceptive alternatives.   Advised to avoid cigarette smoking.  I discussed with the patient that most people either abstain from alcohol or drink within safe limits (<=14/week and <=4 drinks/occasion for males, <=7/weeks and <= 3 drinks/occasion for females) and that the risk for alcohol disorders and other health effects rises proportionally with the number of  drinks per week and how often a drinker exceeds daily limits.  Discussed cessation/primary prevention of drug use and availability of treatment for abuse.   Diet: Encouraged to adjust caloric intake to maintain  or achieve ideal body weight, to reduce intake of dietary saturated fat and total fat, to limit sodium intake by avoiding high sodium foods and not adding table salt, and to maintain adequate dietary potassium and calcium preferably from fresh fruits, vegetables, and low-fat dairy products.    stressed the importance of regular exercise  Injury prevention: Discussed safety belts, safety helmets, smoke detector, smoking near bedding or upholstery.   Dental health: Discussed importance of regular tooth brushing, flossing, and dental visits.   Follow up plan: NEXT PREVENTATIVE PHYSICAL DUE IN 1 YEAR. No follow-ups on file.
